# Patient Record
Sex: Male | Born: 1980
Health system: Southern US, Community
[De-identification: ages and names within clinical notes are randomized; demographics above are authoritative.]

## PROBLEM LIST (undated history)

## (undated) DIAGNOSIS — I10 Essential (primary) hypertension: Secondary | ICD-10-CM

## (undated) DIAGNOSIS — T63441A Toxic effect of venom of bees, accidental (unintentional), initial encounter: Secondary | ICD-10-CM

## (undated) DIAGNOSIS — F419 Anxiety disorder, unspecified: Secondary | ICD-10-CM

## (undated) DIAGNOSIS — L404 Guttate psoriasis: Secondary | ICD-10-CM

## (undated) DIAGNOSIS — I861 Scrotal varices: Secondary | ICD-10-CM

## (undated) HISTORY — DX: Toxic effect of venom of bees, accidental (unintentional), initial encounter: T63.441A

## (undated) HISTORY — DX: Essential (primary) hypertension: I10

## (undated) HISTORY — DX: Anxiety disorder, unspecified: F41.9

## (undated) HISTORY — DX: Guttate psoriasis: L40.4

## (undated) HISTORY — DX: Scrotal varices: I86.1

## (undated) HISTORY — PX: WISDOM TOOTH EXTRACTION: SHX21

---
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
## 2002-03-11 ENCOUNTER — Encounter: Admission: RE | Admit: 2002-03-11 | Discharge: 2002-03-11 | Payer: Self-pay | Admitting: Family Medicine

## 2002-03-11 ENCOUNTER — Encounter: Payer: Self-pay | Admitting: Family Medicine

## 2007-04-14 ENCOUNTER — Ambulatory Visit: Payer: Self-pay | Admitting: Internal Medicine

## 2007-04-22 LAB — CONVERTED CEMR LAB
ALT: 21 units/L (ref 0–53)
AST: 16 units/L (ref 0–37)
BUN: 11 mg/dL (ref 6–23)
Basophils Absolute: 0 10*3/uL (ref 0.0–0.1)
Basophils Relative: 0.6 % (ref 0.0–1.0)
CO2: 31 meq/L (ref 19–32)
Calcium: 10.2 mg/dL (ref 8.4–10.5)
Chloride: 106 meq/L (ref 96–112)
Cholesterol: 165 mg/dL (ref 0–200)
Creatinine, Ser: 1 mg/dL (ref 0.4–1.5)
Eosinophils Absolute: 0.2 10*3/uL (ref 0.0–0.7)
Eosinophils Relative: 5 % (ref 0.0–5.0)
GFR calc Af Amer: 117 mL/min
GFR calc non Af Amer: 97 mL/min
Glucose, Bld: 89 mg/dL (ref 70–99)
HCT: 51.7 % (ref 39.0–52.0)
Hemoglobin: 16.7 g/dL (ref 13.0–17.0)
Lymphocytes Relative: 41.4 % (ref 12.0–46.0)
MCHC: 32.4 g/dL (ref 30.0–36.0)
MCV: 87.5 fL (ref 78.0–100.0)
Monocytes Absolute: 0.4 10*3/uL (ref 0.1–1.0)
Monocytes Relative: 9.6 % (ref 3.0–12.0)
Neutro Abs: 1.9 10*3/uL (ref 1.4–7.7)
Neutrophils Relative %: 43.4 % (ref 43.0–77.0)
Platelets: 229 10*3/uL (ref 150–400)
Potassium: 4.9 meq/L (ref 3.5–5.1)
RBC: 5.91 M/uL — ABNORMAL HIGH (ref 4.22–5.81)
RDW: 11.9 % (ref 11.5–14.6)
Sodium: 141 meq/L (ref 135–145)
TSH: 0.67 microintl units/mL (ref 0.35–5.50)
WBC: 4.3 10*3/uL — ABNORMAL LOW (ref 4.5–10.5)

## 2008-02-03 ENCOUNTER — Ambulatory Visit: Payer: Self-pay | Admitting: Internal Medicine

## 2008-02-03 DIAGNOSIS — N508 Other specified disorders of male genital organs: Secondary | ICD-10-CM | POA: Insufficient documentation

## 2008-02-03 LAB — CONVERTED CEMR LAB
Bilirubin Urine: NEGATIVE
Blood in Urine, dipstick: NEGATIVE
Glucose, Urine, Semiquant: NEGATIVE
Ketones, urine, test strip: NEGATIVE
Nitrite: NEGATIVE
Protein, U semiquant: NEGATIVE
Specific Gravity, Urine: <1.005
Urobilinogen, UA: 0.2
WBC Urine, dipstick: NEGATIVE
pH: 7.5

## 2008-02-16 ENCOUNTER — Encounter: Payer: Self-pay | Admitting: Internal Medicine

## 2008-02-16 ENCOUNTER — Encounter: Admission: RE | Admit: 2008-02-16 | Discharge: 2008-02-16 | Payer: Self-pay | Admitting: Internal Medicine

## 2008-02-18 ENCOUNTER — Telehealth: Payer: Self-pay | Admitting: Internal Medicine

## 2008-03-16 ENCOUNTER — Ambulatory Visit: Payer: Self-pay | Admitting: Internal Medicine

## 2008-04-29 ENCOUNTER — Telehealth (INDEPENDENT_AMBULATORY_CARE_PROVIDER_SITE_OTHER): Payer: Self-pay | Admitting: *Deleted

## 2008-06-02 ENCOUNTER — Ambulatory Visit: Payer: Self-pay | Admitting: Internal Medicine

## 2008-06-02 DIAGNOSIS — I10 Essential (primary) hypertension: Secondary | ICD-10-CM | POA: Insufficient documentation

## 2008-12-05 ENCOUNTER — Ambulatory Visit: Payer: Self-pay | Admitting: Internal Medicine

## 2008-12-05 DIAGNOSIS — F411 Generalized anxiety disorder: Secondary | ICD-10-CM | POA: Insufficient documentation

## 2008-12-26 ENCOUNTER — Ambulatory Visit: Payer: Self-pay | Admitting: Internal Medicine

## 2009-03-29 ENCOUNTER — Ambulatory Visit: Payer: Self-pay | Admitting: Internal Medicine

## 2009-09-27 ENCOUNTER — Ambulatory Visit: Payer: Self-pay | Admitting: Internal Medicine

## 2009-09-28 LAB — CONVERTED CEMR LAB
BUN: 13 mg/dL (ref 6–23)
Basophils Absolute: 0 10*3/uL (ref 0.0–0.1)
Basophils Relative: 0.5 % (ref 0.0–3.0)
CO2: 29 meq/L (ref 19–32)
Calcium: 9.9 mg/dL (ref 8.4–10.5)
Chloride: 103 meq/L (ref 96–112)
Creatinine, Ser: 0.9 mg/dL (ref 0.4–1.5)
Eosinophils Absolute: 0.1 10*3/uL (ref 0.0–0.7)
Eosinophils Relative: 2.7 % (ref 0.0–5.0)
GFR calc non Af Amer: 113.76 mL/min (ref >60)
Glucose, Bld: 90 mg/dL (ref 70–99)
HCT: 47.5 % (ref 39.0–52.0)
Hemoglobin: 16.2 g/dL (ref 13.0–17.0)
Lymphocytes Relative: 31.4 % (ref 12.0–46.0)
Lymphs Abs: 1.7 10*3/uL (ref 0.7–4.0)
MCHC: 34.2 g/dL (ref 30.0–36.0)
MCV: 89 fL (ref 78.0–100.0)
Monocytes Absolute: 0.5 10*3/uL (ref 0.1–1.0)
Monocytes Relative: 9.9 % (ref 3.0–12.0)
Neutro Abs: 3.1 10*3/uL (ref 1.4–7.7)
Neutrophils Relative %: 55.5 % (ref 43.0–77.0)
Platelets: 256 10*3/uL (ref 150.0–400.0)
Potassium: 4 meq/L (ref 3.5–5.1)
RBC: 5.34 M/uL (ref 4.22–5.81)
RDW: 12.7 % (ref 11.5–14.6)
Sodium: 139 meq/L (ref 135–145)
TSH: 0.62 microintl units/mL (ref 0.35–5.50)
WBC: 5.5 10*3/uL (ref 4.5–10.5)

## 2010-01-30 ENCOUNTER — Other Ambulatory Visit: Payer: Self-pay | Admitting: Family Medicine

## 2010-01-30 ENCOUNTER — Ambulatory Visit
Admission: RE | Admit: 2010-01-30 | Discharge: 2010-01-30 | Payer: Self-pay | Source: Home / Self Care | Attending: Family Medicine | Admitting: Family Medicine

## 2010-01-30 DIAGNOSIS — R21 Rash and other nonspecific skin eruption: Secondary | ICD-10-CM | POA: Insufficient documentation

## 2010-01-30 LAB — HEPATIC FUNCTION PANEL
ALT: 20 U/L (ref 0–53)
AST: 16 U/L (ref 0–37)
Albumin: 4.6 g/dL (ref 3.5–5.2)
Alkaline Phosphatase: 55 U/L (ref 39–117)
Bilirubin, Direct: 0.2 mg/dL (ref 0.0–0.3)
Total Protein: 7.7 g/dL (ref 6.0–8.3)

## 2010-01-30 LAB — CBC WITH DIFFERENTIAL/PLATELET
Basophils Relative: 0.4 % (ref 0.0–3.0)
Eosinophils Absolute: 0.1 10*3/uL (ref 0.0–0.7)
HCT: 50 % (ref 39.0–52.0)
Hemoglobin: 17.1 g/dL — ABNORMAL HIGH (ref 13.0–17.0)
Lymphocytes Relative: 25 % (ref 12.0–46.0)
Lymphs Abs: 2 10*3/uL (ref 0.7–4.0)
MCHC: 34.2 g/dL (ref 30.0–36.0)
MCV: 87.8 fl (ref 78.0–100.0)
Monocytes Absolute: 0.7 10*3/uL (ref 0.1–1.0)
Monocytes Relative: 9 % (ref 3.0–12.0)
Neutro Abs: 5.1 10*3/uL (ref 1.4–7.7)
Neutrophils Relative %: 64 % (ref 43.0–77.0)
Platelets: 319 10*3/uL (ref 150.0–400.0)
RBC: 5.69 Mil/uL (ref 4.22–5.81)
WBC: 7.9 10*3/uL (ref 4.5–10.5)

## 2010-01-30 LAB — BASIC METABOLIC PANEL
BUN: 14 mg/dL (ref 6–23)
CO2: 31 mEq/L (ref 19–32)
Calcium: 10 mg/dL (ref 8.4–10.5)
Chloride: 100 mEq/L (ref 96–112)
Creatinine, Ser: 0.9 mg/dL (ref 0.4–1.5)
GFR: 107.62 mL/min (ref >60.00)
Glucose, Bld: 86 mg/dL (ref 70–99)
Potassium: 4.5 mEq/L (ref 3.5–5.1)

## 2010-01-30 LAB — SEDIMENTATION RATE: Sed Rate: 3 mm/hr (ref 0–22)

## 2010-02-06 NOTE — Assessment & Plan Note (Signed)
Summary: 4 mth fu/ns/kdc   Vital Signs:  Patient profile:   30 year old male Height:      71 inches Weight:      175 pounds BMI:     24.50 Pulse rate:   76 / minute BP sitting:   114 / 80  Vitals Entered By: Shary Decamp (March 29, 2009 4:02 PM) CC: rov - no concerns   History of Present Illness: routine office visit  Allergies: No Known Drug Allergies  Past History:  Past Medical History: Reviewed history from 12/05/2008 and no changes required. asthma as a child STING HORNETS WASPS&BEES CAUSE POISN&TOX REACT   Hypertension  started meds (Norvasc) 01-2008 Anxiety  Past Surgical History: Reviewed history from 04/14/2007 and no changes required. no surgeries  Social History: separated  no children works for the [employer]--no ETOH-- rarely   Review of Systems       was very anxious last year after his wife left him.  Feels much better now, going through a divorce but emotional  he feels okay denies suicidal or violent thoughts he is taking his BP medication daily, no apparent side effects, denies constipation  Physical Exam  General:  alert and well-developed.   Lungs:  normal respiratory effort, no intercostal retractions, and no accessory muscle use.   Heart:  normal rate, regular rhythm, and no murmur.   Extremities:  no pretibial edema bilaterally  Psych:  Oriented X3, memory intact for recent and remote, normally interactive, good eye contact, not anxious appearing, not depressed appearing, and not agitated.     Impression & Recommendations:  Problem # 1:  ANXIETY (ICD-300.00) doing great  Problem # 2:  HYPERTENSION (ICD-401.9) well-controlled no change recheck in 6 months his updated medication list for this problem includes:    Amlodipine Besylate 10 Mg Tabs (Amlodipine besylate) .Marland Kitchen... 1 by mouth once daily  Complete Medication List: 1)  Epipen 0.3 Mg/0.62ml (1:1000) Devi (Epinephrine hcl (anaphylaxis)) .... As directed 2)  Amlodipine  Besylate 10 Mg Tabs (Amlodipine besylate) .Marland Kitchen.. 1 by mouth once daily  Patient Instructions: 1)  Please schedule a follow-up appointment in 6 months .  Prescriptions: AMLODIPINE BESYLATE 10 MG TABS (AMLODIPINE BESYLATE) 1 by mouth once daily  #90 x 2   Entered and Authorized by:   Nolon Rod. Antasia Haider MD   Signed by:   Nolon Rod. Berley Gambrell MD on 03/29/2009   Method used:   Print then Give to Patient   RxID:   [id number]

## 2010-02-06 NOTE — Assessment & Plan Note (Signed)
Summary: 6 MONTH FOLLOWUP///SPH   Vital Signs:  Patient profile:   30 year old male Weight:      185 pounds Pulse rate:   85 / minute Pulse rhythm:   regular BP sitting:   142 / 86  (left arm) Cuff size:   large  Vitals Entered By: Army Fossa CMA (September 27, 2009 8:10 AM) CC: Pt here for f/u on BP, not fasting Comments Pharm- [facility] [address] Declines flu shot   History of Present Illness: ROV feels well, no concerns  Current Medications (verified): 1)  Epipen 0.3 Mg/0.4ml (1:1000)  Devi (Epinephrine Hcl (Anaphylaxis)) .... As Directed 2)  Amlodipine Besylate 10 Mg Tabs (Amlodipine Besylate) .Marland Kitchen.. 1 By Mouth Once Daily  Allergies (verified): No Known Drug Allergies  Past History:  Past Medical History: Reviewed history from 12/05/2008 and no changes required. asthma as a child STING HORNETS WASPS&BEES CAUSE POISN&TOX REACT   Hypertension  started meds (Norvasc) 01-2008 Anxiety  Past Surgical History: Reviewed history from 04/14/2007 and no changes required. no surgeries  Social History: Reviewed history from 03/29/2009 and no changes required. separated  no children works for the [employer]--no ETOH-- rarely   Review of Systems       denies lower extremity edema No nausea vomiting or diarrhea Ambulatory BP check rarely, last time it was 122/80 diet and exercise are okay but knows  there is room for improvement  Physical Exam  General:  alert, well-developed, and well-nourished.   Lungs:  normal respiratory effort, no intercostal retractions, and no accessory muscle use.   Heart:  normal rate, regular rhythm, and no murmur.   Extremities:  no pretibial edema bilaterally    Impression & Recommendations:  Problem # 1:  HYPERTENSION (ICD-401.9) systolic BP slightly high today, see  instructions Labs His updated medication list for this problem includes:    Amlodipine Besylate 10 Mg Tabs (Amlodipine besylate) .Marland Kitchen... 1 by mouth once  daily  BP today: 142/86 Prior BP: 114/80 (03/29/2009)  Labs Reviewed: K+: 4.9 (04/14/2007) Creat: : 1.0 (04/14/2007)   Chol: 165 (04/14/2007)     Orders: Venipuncture (10272) TLB-BMP (Basic Metabolic Panel-BMET) (80048-METABOL) TLB-CBC Platelet - w/Differential (85025-CBCD) TLB-TSH (Thyroid Stimulating Hormone) (84443-TSH)  Problem # 2:  explained the benefits of a flu shot, patient declined  to take it  Complete Medication List: 1)  Epipen 0.3 Mg/0.73ml (1:1000) Devi (Epinephrine hcl (anaphylaxis)) .... As directed 2)  Amlodipine Besylate 10 Mg Tabs (Amlodipine besylate) .Marland Kitchen.. 1 by mouth once daily  Other Orders: Specimen Handling (53664)  Patient Instructions: 1)  Check your blood pressure 2 or 3 times a month. If it is more than 140/85 consistently,please let us know . Ideal BP 120/80 2)  Please schedule a follow-up appointment in 6 to 8  months , fasting , physical exam Prescriptions: AMLODIPINE BESYLATE 10 MG TABS (AMLODIPINE BESYLATE) 1 by mouth once daily  #90 x 3   Entered and Authorized by:   Elita Quick E. Paz MD   Signed by:   Nolon Rod. Paz MD on 09/27/2009   Method used:   Print then Give to Patient   RxID:   [id number]

## 2010-02-08 NOTE — Assessment & Plan Note (Signed)
Summary: rash/paz pt needed morning appt/kn   Vital Signs:  Patient profile:   30 year old male Height:      71 inches Weight:      187.4 pounds BMI:     26.23 Pulse rate:   80 / minute Pulse rhythm:   regular BP sitting:   122 / 74  (right arm) Cuff size:   large  Vitals Entered By: Almeta Monas CMA Duncan Dull) (January 30, 2010 10:58 AM) CC: c/o rash on the chest, arms and neck x 2 weeks--treated with prednisone with no relief, Rash   History of Present Illness:  Rash      This is a 30 year old man who presents with Rash.  The symptoms began 3 weeks ago.  Pt went to [facility] care and was given 5 days prednisone but it just got worse when he finished it and it spread.  It started on abd.  Pt denies any illness prior to rash breaking out.  The patient complains of macules and itching.  The rash is located on the chest, back, abdomen, right arm, left arm, right leg, and left leg.  The rash is worse with heat.  The patient denies the following symptoms: fever, headache, facial swelling, tongue swelling, burning, difficulty breathing, abdominal pain, nausea, vomiting, diarrhea, dizziness, sore throat, dysuria, eye symptoms, arthralgias, and vaginal discharge.  The patient denies history of recent tick bite, recent tick exposure, other insect bite, recent infection, recent antibiotic use, new medication, new clothing, new topical exposure, recent travel, pet/animal contact, thyroid disease, chronic liver disease, autoimmune disease, chronic edema, and prior STD.    Current Medications (verified): 1)  Epipen 0.3 Mg/0.63ml (1:1000)  Devi (Epinephrine Hcl (Anaphylaxis)) .... As Directed 2)  Amlodipine Besylate 10 Mg Tabs (Amlodipine Besylate) .Marland Kitchen.. 1 By Mouth Once Daily  Allergies (verified): No Known Drug Allergies  Past History:  Past medical, surgical, family and social histories (including risk factors) reviewed for relevance to current acute and chronic problems.  Past Medical History: Reviewed  history from 12/05/2008 and no changes required. asthma as a child STING HORNETS WASPS&BEES CAUSE POISN&TOX REACT   Hypertension  started meds (Norvasc) 01-2008 Anxiety  Past Surgical History: Reviewed history from 04/14/2007 and no changes required. no surgeries  Family History: Reviewed history from 04/14/2007 and no changes required. prostate CA-no HTN-M,MGM CAD-MGM STROKE-MGM COLON CA-NO DM--Great aunt  Social History: Reviewed history from 03/29/2009 and no changes required. separated  no children works for the [employer] tobacco--no ETOH-- rarely   Review of Systems      See HPI  Physical Exam  General:  Well-developed,well-nourished,in no acute distress; alert,appropriate and cooperative throughout examination Skin:  macular rash--- some large plaques, scaley as well on arms, chest , back , abd , and legs Cervical Nodes:  No lymphadenopathy noted Psych:  Oriented X3 and normally interactive.     Impression & Recommendations:  Problem # 1:  SKIN RASH (ICD-782.1) prednisone taper claritin for itching  Orders: Depo- Medrol 80mg  (J1040) Admin of Therapeutic Inj  intramuscular or subcutaneous (04540) Venipuncture (98119) TLB-BMP (Basic Metabolic Panel-BMET) (80048-METABOL) TLB-CBC Platelet - w/Differential (85025-CBCD) TLB-Hepatic/Liver Function Pnl (80076-HEPATIC) TLB-Sedimentation Rate (ESR) (85652-ESR) Specimen Handling (14782) Dermatology Referral (Derma)  Discussed medication use and symptom control.   Complete Medication List: 1)  Epipen 0.3 Mg/0.49ml (1:1000) Devi (Epinephrine hcl (anaphylaxis)) .... As directed 2)  Amlodipine Besylate 10 Mg Tabs (Amlodipine besylate) .Marland Kitchen.. 1 by mouth once daily 3)  Prednisone 10 Mg Tabs (Prednisone) .... 3  by mouth once daily for 3 days then 2 by mouth once daily for 3 days then 1 by mouth once daily for 3 days 4)  Claritin 10 Mg Tabs (Loratadine) .Marland Kitchen.. 1 by mouth once daily as needed itching Prescriptions: PREDNISONE  10 MG TABS (PREDNISONE) 3 by mouth once daily for 3 days then 2 by mouth once daily for 3 days then 1 by mouth once daily for 3 days  #18 x 0   Entered and Authorized by:   Loreen Freud DO   Signed by:   Loreen Freud DO on 01/30/2010   Method used:   Electronically to        [facility]. [phone]* (retail)       [address]       [place], [place]  [address]       Ph: [phone]       Fax: [phone]   RxID:   [id number]    Medication Administration  Injection # 1:    Medication: Depo- Medrol 80mg     Diagnosis: SKIN RASH (ICD-782.1)    Route: IM    Site: RUOQ gluteus    Exp Date: 07/08/2010    Lot #: [id number]    Mfr: Pharmacia    Patient tolerated injection without complications    Given by: Almeta Monas CMA Duncan Dull) (January 30, 2010 11:09 AM)  Orders Added: 1)  Est. Patient Level III [44010] 2)  Depo- Medrol 80mg  [J1040] 3)  Admin of Therapeutic Inj  intramuscular or subcutaneous [96372] 4)  Venipuncture [36415] 5)  TLB-BMP (Basic Metabolic Panel-BMET) [80048-METABOL] 6)  TLB-CBC Platelet - w/Differential [85025-CBCD] 7)  TLB-Hepatic/Liver Function Pnl [80076-HEPATIC] 8)  TLB-Sedimentation Rate (ESR) [85652-ESR] 9)  Specimen Handling [99000] 10)  Dermatology Referral [Derma]

## 2010-02-10 ENCOUNTER — Encounter: Payer: Self-pay | Admitting: Family Medicine

## 2010-03-06 NOTE — Consult Note (Signed)
Summary: Dermatology-Dr. Nita Sells  Dermatology-Dr. Nita Sells   Imported By: Maryln Gottron 02/27/2010 13:42:26  _____________________________________________________________________  External Attachment:    Type:   Image     Comment:   External Document

## 2010-03-08 ENCOUNTER — Encounter: Payer: Self-pay | Admitting: Internal Medicine

## 2010-04-24 ENCOUNTER — Encounter: Payer: Self-pay | Admitting: Internal Medicine

## 2010-04-24 ENCOUNTER — Ambulatory Visit (INDEPENDENT_AMBULATORY_CARE_PROVIDER_SITE_OTHER): Payer: Self-pay | Admitting: Internal Medicine

## 2010-04-24 DIAGNOSIS — I10 Essential (primary) hypertension: Secondary | ICD-10-CM

## 2010-04-24 DIAGNOSIS — I861 Scrotal varices: Secondary | ICD-10-CM | POA: Insufficient documentation

## 2010-04-24 DIAGNOSIS — L404 Guttate psoriasis: Secondary | ICD-10-CM

## 2010-04-24 DIAGNOSIS — Z0000 Encounter for general adult medical examination without abnormal findings: Secondary | ICD-10-CM | POA: Insufficient documentation

## 2010-04-24 HISTORY — DX: Scrotal varices: I86.1

## 2010-04-24 HISTORY — DX: Guttate psoriasis: L40.4

## 2010-04-24 LAB — LIPID PANEL
Cholesterol: 152 mg/dL (ref 0–200)
HDL: 30 mg/dL — ABNORMAL LOW (ref >39.00)
Triglycerides: 88 mg/dL (ref 0.0–149.0)
VLDL: 17.6 mg/dL (ref 0.0–40.0)

## 2010-04-24 MED ORDER — AMLODIPINE BESYLATE 10 MG PO TABS: 10.0000 mg | ORAL_TABLET | Freq: Every day | ORAL | Status: DC

## 2010-04-24 NOTE — Assessment & Plan Note (Addendum)
Td 4-09 Diet and exercise discussed Has a history of varicocele, implications in reference to fertility discussed. Referral to urology if so desired. states will think about it  Labs

## 2010-04-24 NOTE — Progress Notes (Signed)
  Subjective:    Patient ID: Christopher Lester, male    DOB: [birth year], 30 y.o.   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 He did that, rash is not better, it is on and off. No pruritus  Past Medical History  Diagnosis Date  . Asthma     as a child  . Anxiety   . 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 HTN (hypertension)     started meds 2010   No past surgical history on file. History   Social History  . Marital Status: Married    Spouse Name: N/A    Number of Children:    . Years of Education: N/A   Occupational History  . works for the [place] [employer]   .     Social History Main Topics  . Smoking status: Never Smoker   . Smokeless tobacco: Never Used  . Alcohol Use: No     rarely  . Drug Use: No  . Sexually Active: Not on file   Other Topics Concern  . Not on file   Social History Narrative   Exercise- active @ work; diet needs to improve    Family History  Problem Relation Age of Onset  . Hypertension Mother   . Hypertension Maternal Grandmother   . Coronary artery disease Maternal Grandmother   . Stroke Maternal Grandmother   . Diabetes      aunt  . Colon cancer Neg Hx   . Prostate cancer Neg Hx      Review of Systems  Constitutional: Negative for fever and unexpected weight change.  Respiratory: Negative for cough and wheezing.   Cardiovascular: Negative for chest pain and leg swelling.  Gastrointestinal: Negative for nausea, vomiting and abdominal pain.  Genitourinary: Negative for urgency and hematuria.       Objective:   Physical Exam  Constitutional: He is oriented to person, place, and time. He appears well-developed and well-nourished.  HENT:  Head: Normocephalic and atraumatic.  Neck: No thyromegaly present.  Cardiovascular: Normal rate, regular rhythm and normal heart sounds.  Exam reveals no friction rub.   No murmur  heard. Pulmonary/Chest: Breath sounds normal. No respiratory distress. He has no wheezes. He has no rales.  Abdominal: Soft. Bowel sounds are normal. He exhibits no distension. There is no tenderness. There is no rebound and no guarding.  Musculoskeletal: He exhibits no edema.  Neurological: He is alert and oriented to person, place, and time.  Skin: Skin is warm.       Multiple skin lesions abdomen, legs >back>arms. None @ face. Lesions are papular, moderately red, not scaly, less than 1 cm in size.  Psychiatric: He has a normal mood and affect. His behavior is normal. Judgment and thought content normal.          Assessment & Plan:

## 2010-04-24 NOTE — Assessment & Plan Note (Signed)
Recommend to see dermatology again

## 2010-04-24 NOTE — Assessment & Plan Note (Signed)
No change 

## 2010-04-26 ENCOUNTER — Telehealth: Payer: Self-pay | Admitting: *Deleted

## 2010-04-26 NOTE — Telephone Encounter (Signed)
Message copied by Army Fossa on Thu Apr 26, 2010  9:40 AM ------      Message from: Christopher Lester      Created: Wed Apr 25, 2010  5:27 PM       Advise patient:      His cholesterol is very good, only the HDL (good cholesterol) needs to improve, it is 30 and ideally should be 40.      Exercise and a healthy diet will help.

## 2010-04-26 NOTE — Telephone Encounter (Signed)
Message left for patient to return my call.  

## 2010-04-26 NOTE — Telephone Encounter (Signed)
I spoke w/ pt he is aware.  

## 2010-10-24 ENCOUNTER — Encounter: Payer: Self-pay | Admitting: Internal Medicine

## 2010-10-24 ENCOUNTER — Ambulatory Visit (INDEPENDENT_AMBULATORY_CARE_PROVIDER_SITE_OTHER): Payer: BC Managed Care – PPO | Admitting: Internal Medicine

## 2010-10-24 VITALS — BP 126/82 | HR 89 | Temp 99.1°F | Resp 16 | Wt 191.0 lb

## 2010-10-24 DIAGNOSIS — L404 Guttate psoriasis: Secondary | ICD-10-CM

## 2010-10-24 DIAGNOSIS — I10 Essential (primary) hypertension: Secondary | ICD-10-CM

## 2010-10-24 DIAGNOSIS — F411 Generalized anxiety disorder: Secondary | ICD-10-CM

## 2010-10-24 DIAGNOSIS — L408 Other psoriasis: Secondary | ICD-10-CM

## 2010-10-24 NOTE — Assessment & Plan Note (Signed)
Lesions self resolve a few months ago without treatment

## 2010-10-24 NOTE — Assessment & Plan Note (Signed)
Well controlled, continue with same meds. His last cholesterol panel was discussed, HDL is slightly low, recommend daily exercise

## 2010-10-24 NOTE — Progress Notes (Signed)
  Subjective:    Patient ID: Christopher Lester, male    DOB: 09-05-1980, 30 y.o.   MRN: [id number]  HPI Routine office visit, feeling well.  Past Medical History  Diagnosis Date  . Asthma     as a child  . Anxiety   . 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 HTN (hypertension)     started meds 2010  . Guttate psoriasis 04/24/2010   Past Surgical History  Procedure Date  . No past surgeries       Review of Systems Good medication compliance with BP meds, ambulatory blood pressures in the 120/80 He was diagnosed with psoriasis, it self resolve without treatment. lifestyle continued to be healthy. Denies any anxiety at the present time    Objective:   Physical Exam  Constitutional: He is oriented to person, place, and time. He appears well-developed and well-nourished.  Cardiovascular: Normal rate, regular rhythm and normal heart sounds.   No murmur heard. Pulmonary/Chest: Effort normal and breath sounds normal. No respiratory distress. He has no wheezes. He has no rales.  Musculoskeletal: He exhibits no edema.  Neurological: He is alert and oriented to person, place, and time.  Psychiatric: He has a normal mood and affect. His behavior is normal. Judgment and thought content normal.          Assessment & Plan:

## 2010-10-24 NOTE — Assessment & Plan Note (Signed)
asx at present

## 2011-02-27 ENCOUNTER — Other Ambulatory Visit: Payer: Self-pay | Admitting: Internal Medicine

## 2011-02-27 NOTE — Telephone Encounter (Signed)
Refill done.  

## 2011-04-24 ENCOUNTER — Ambulatory Visit (INDEPENDENT_AMBULATORY_CARE_PROVIDER_SITE_OTHER): Payer: PRIVATE HEALTH INSURANCE | Admitting: Internal Medicine

## 2011-04-24 ENCOUNTER — Encounter: Payer: Self-pay | Admitting: Internal Medicine

## 2011-04-24 DIAGNOSIS — I10 Essential (primary) hypertension: Secondary | ICD-10-CM

## 2011-04-24 DIAGNOSIS — Z0000 Encounter for general adult medical examination without abnormal findings: Secondary | ICD-10-CM

## 2011-04-24 LAB — COMPREHENSIVE METABOLIC PANEL
Alkaline Phosphatase: 52 U/L (ref 39–117)
BUN: 12 mg/dL (ref 6–23)
Creatinine, Ser: 0.7 mg/dL (ref 0.4–1.5)
Glucose, Bld: 87 mg/dL (ref 70–99)
Total Bilirubin: 1.3 mg/dL — ABNORMAL HIGH (ref 0.3–1.2)

## 2011-04-24 LAB — LIPID PANEL
Cholesterol: 150 mg/dL (ref 0–200)
LDL Cholesterol: 103 mg/dL — ABNORMAL HIGH (ref 0–99)
Triglycerides: 56 mg/dL (ref 0.0–149.0)
VLDL: 11.2 mg/dL (ref 0.0–40.0)

## 2011-04-24 MED ORDER — EPINEPHRINE 0.3 MG/0.3ML IJ DEVI: 0.3000 mg | Freq: Once | INTRAMUSCULAR | Status: DC

## 2011-04-24 MED ORDER — AMLODIPINE BESYLATE 10 MG PO TABS: 10.0000 mg | ORAL_TABLET | Freq: Every day | ORAL | Status: DC

## 2011-04-24 NOTE — Patient Instructions (Addendum)
You're doing great, continue with your healthy lifestyle. If his psoriasis is not getting better, please contact a dermatologist. ---- Check the  blood pressure 2 or 3 times a week, be sure it is less than 135/85. If it is consistently higher, let me know

## 2011-04-24 NOTE — Assessment & Plan Note (Addendum)
Td 4-09 Diet and exercise discussed Labs STE   

## 2011-04-24 NOTE — Assessment & Plan Note (Signed)
Well-controlled, no apparent side effects. Refill medications.

## 2011-04-24 NOTE — Progress Notes (Signed)
  Subjective:    Patient ID: Christopher Lester, male    DOB: 11/13/1980, 31 y.o.   MRN: [id number]  HPI Yearly checkup, feeling well. No major concerns.  Past medical history Hypertension, started medications in 2010 Asthma as a child Anxiety guttate psoriasis, diagnosed 04/2010 Severe allergic reactions to bee stings and hornets  PSH No major  Family History:  colon ca-- no   prostate CA-no HTN-M,MGM CAD-MGM STROKE-MGM DM--Great aunt  Social History: Married, 1 children works for the [employer] Tobacco-- no ETOH-- rarely Diet-- has improved significantly, + wt loss Exercise-- remains active    Review of Systems Has a lot of stress at work but handling it okay. Good medication compliance with amlodipine, ambulatory BP is around 120/80. No chest pain, shortness of breath or lower extremity edema No nausea, vomiting or diarrhea. No dysuria or gross hematuria. He has psoriasis, symptoms exacerbated November 2012, and they are getting better by itself     Objective:   Physical Exam General -- alert, well-developed, and well-nourished.   Neck --no thyromegaly  Lungs -- normal respiratory effort, no intercostal retractions, no accessory muscle use, and normal breath sounds.   Heart-- normal rate, regular rhythm, no murmur, and no gallop.   Abdomen--soft, non-tender, no distention, no masses, no HSM, no guarding, and no rigidity.   Extremities-- no pretibial edema bilaterally; a few lesions distally consistent with the history of psoriasis Neurologic-- alert & oriented X3 and strength normal in all extremities. Psych-- Cognition and judgment appear intact. Alert and cooperative with normal attention span and concentration.  not anxious appearing and not depressed appearing.       Assessment & Plan:

## 2011-12-02 ENCOUNTER — Encounter: Payer: Self-pay | Admitting: Internal Medicine

## 2011-12-02 ENCOUNTER — Ambulatory Visit (INDEPENDENT_AMBULATORY_CARE_PROVIDER_SITE_OTHER): Payer: PRIVATE HEALTH INSURANCE | Admitting: Internal Medicine

## 2011-12-02 VITALS — BP 132/84 | HR 98 | Wt 165.0 lb

## 2011-12-02 DIAGNOSIS — K469 Unspecified abdominal hernia without obstruction or gangrene: Secondary | ICD-10-CM

## 2011-12-02 NOTE — Patient Instructions (Addendum)
Hernia A hernia occurs when an internal organ pushes out through a weak spot in the abdominal wall. Hernias most commonly occur in the groin and around the navel. Hernias often can be pushed back into place (reduced). Most hernias tend to get worse over time. Some abdominal hernias can get stuck in the opening (irreducible or incarcerated hernia) and cannot be reduced. An irreducible abdominal hernia which is tightly squeezed into the opening is at risk for impaired blood supply (strangulated hernia). A strangulated hernia is a medical emergency. Because of the risk for an irreducible or strangulated hernia, surgery may be recommended to repair a hernia. CAUSES   Heavy lifting.  Prolonged coughing.  Straining to have a bowel movement.  A cut (incision) made during an abdominal surgery. HOME CARE INSTRUCTIONS   Bed rest is not required. You may continue your normal activities.  Avoid lifting more than 10 pounds (4.5 kg) or straining.  Cough gently. If you are a smoker it is best to stop. Even the best hernia repair can break down with the continual strain of coughing. Even if you do not have your hernia repaired, a cough will continue to aggravate the problem.  Do not wear anything tight over your hernia. Do not try to keep it in with an outside bandage or truss. These can damage abdominal contents if they are trapped within the hernia sac.  Eat a normal diet.  Avoid constipation. Straining over long periods of time will increase hernia size and encourage breakdown of repairs. If you cannot do this with diet alone, stool softeners may be used. SEEK IMMEDIATE MEDICAL CARE IF:   You have a fever.  You develop increasing abdominal pain.  You feel nauseous or vomit.  Your hernia is stuck outside the abdomen, looks discolored, feels hard, or is tender.  You have any changes in your bowel habits or in the hernia that are unusual for you.  You have increased pain or swelling around the  hernia.  You cannot push the hernia back in place by applying gentle pressure while lying down. MAKE SURE YOU:   Understand these instructions.  Will watch your condition.  Will get help right away if you are not doing well or get worse. Document Released: 12/24/2004 Document Revised: 03/18/2011 Document Reviewed: 08/13/2007 [employer] Patient Information 2013 [employer].  

## 2011-12-02 NOTE — Progress Notes (Signed)
  Subjective:    Patient ID: Christopher Lester, male    DOB: 1981-01-04, 31 y.o.   MRN: [id number]  HPI Acute visit 4 weeks history of on and off mild discomfort at the left suprapubic area, also has a lump there on and off, usually triggered by coughing, lifting or having a BM  Past medical history Hypertension, started medications in 2010 Asthma as a child Anxiety guttate psoriasis, diagnosed 04/2010 Severe allergic reactions to bee stings and hornets  PSH No major   Family History:  colon ca-- no   prostate CA-no HTN-M,MGM CAD-MGM STROKE-MGM DM--Great aunt  Social History: Married, 1 children works for the [employer] Tobacco-- no ETOH-- rarely Diet-- has improved significantly, + wt loss Exercise-- remains active     Review of Systems No fever or chills No nausea, vomiting, diarrhea. No abdominal pain Few days ago his urine looked slightly dark however he denies any dysuria, difficulty urinating or gross hematuria. urine is now back to normal.     Objective:   Physical Exam  Constitutional: He appears well-developed and well-nourished. No distress.  Abdominal: Soft. Bowel sounds are normal. He exhibits no distension. There is no tenderness. There is no rebound and no guarding.  Genitourinary:          Penis normal, scrotal contents normal.  Skin: He is not diaphoretic.       Assessment & Plan:

## 2011-12-02 NOTE — Assessment & Plan Note (Signed)
31 year old gentleman with history of varicocele, left more than right presents with what seems to be a inguinal hernia. Discussed the patient what a hernia is, potential complications; plan to  refer to surgery.

## 2011-12-12 ENCOUNTER — Ambulatory Visit (INDEPENDENT_AMBULATORY_CARE_PROVIDER_SITE_OTHER): Payer: PRIVATE HEALTH INSURANCE | Admitting: General Surgery

## 2011-12-12 ENCOUNTER — Encounter (INDEPENDENT_AMBULATORY_CARE_PROVIDER_SITE_OTHER): Payer: Self-pay | Admitting: General Surgery

## 2011-12-12 VITALS — BP 158/98 | HR 76 | Resp 16 | Ht 70.0 in | Wt 165.0 lb

## 2011-12-12 DIAGNOSIS — K469 Unspecified abdominal hernia without obstruction or gangrene: Secondary | ICD-10-CM

## 2011-12-12 NOTE — Patient Instructions (Signed)
If you change how you would like your hernia repaired, please call  Hernia Repair with Laparoscope A hernia occurs when an internal organ pushes out through a weak spot in the belly (abdominal) wall muscles. Hernias most commonly occur in the groin and around the navel. Hernias can also occur through a cut by the surgeon (incision) after an abdominal operation. A hernia may be caused by:  Lifting heavy objects.  Prolonged coughing.  Straining to move your bowels. Hernias can often be pushed back into place (reduced). Most hernias tend to get worse over time. Problems occur when abdominal contents get stuck in the opening and the blood supply is blocked or impaired (incarcerated hernia). Because of these risks, you require surgery to repair the hernia. Your hernia will be repaired using a laparoscope. Laparoscopic surgery is a type of minimally invasive surgery. It does not involve making a typical surgical cut (incision) in the skin. A laparoscope is a telescope-like rod and lens system. It is usually connected to a video camera and a light source so your caregiver can clearly see the operative area. The instruments are inserted through  to  inch (5 mm or 10 mm) openings in the skin at specific locations. A working and viewing space is created by blowing a small amount of carbon dioxide gas into the abdominal cavity. The abdomen is essentially blown up like a balloon (insufflated). This elevates the abdominal wall above the internal organs like a dome. The carbon dioxide gas is common to the human body and can be absorbed by tissue and removed by the respiratory system. Once the repair is completed, the small incisions will be closed with either stitches (sutures) or staples (just like a paper stapler only this staple holds the skin together). LET YOUR CAREGIVERS KNOW ABOUT:  Allergies.  Medications taken including herbs, eye drops, over the counter medications, and creams.  Use of steroids (by  mouth or creams).  Previous problems with anesthetics or Novocaine.  Possibility of pregnancy, if this applies.  History of blood clots (thrombophlebitis).  History of bleeding or blood problems.  Previous surgery.  Other health problems. BEFORE THE PROCEDURE  Laparoscopy can be done either in a hospital or out-patient clinic. You may be given a mild sedative to help you relax before the procedure. Once in the operating room, you will be given a general anesthesia to make you sleep (unless you and your caregiver choose a different anesthetic).  AFTER THE PROCEDURE  After the procedure you will be watched in a recovery area. Depending on what type of hernia was repaired, you might be admitted to the hospital or you might go home the same day. With this procedure you may have less pain and scarring. This usually results in a quicker recovery and less risk of infection. HOME CARE INSTRUCTIONS   Bed rest is not required. You may continue your normal activities but avoid heavy lifting (more than 10 pounds) or straining.  Cough gently. If you are a smoker it is best to stop, as even the best hernia repair can break down with the continual strain of coughing.  Avoid driving until given the OK by your surgeon.  There are no dietary restrictions unless given otherwise.  TAKE ALL MEDICATIONS AS DIRECTED.  Only take over-the-counter or prescription medicines for pain, discomfort, or fever as directed by your caregiver. SEEK MEDICAL CARE IF:   There is increasing abdominal pain or pain in your incisions.  There is more bleeding from incisions,  other than minimal spotting.  You feel light headed or faint.  You develop an unexplained fever, chills, and/or an oral temperature above 102 F (38.9 C).  You have redness, swelling, or increasing pain in the wound.  Pus coming from wound.  A foul smell coming from the wound or dressings. SEEK IMMEDIATE MEDICAL CARE IF:   You develop a  rash.  You have difficulty breathing.  You have any allergic problems. MAKE SURE YOU:   Understand these instructions.  Will watch your condition.  Will get help right away if you are not doing well or get worse. Document Released: 12/24/2004 Document Revised: 03/18/2011 Document Reviewed: 11/23/2008 [facility] Patient Information 2013 [place], [place].

## 2011-12-12 NOTE — Progress Notes (Signed)
Patient ID: Christopher Lester, male   DOB: March 07, 1980, 31 y.o.   MRN: [id number]  Chief Complaint  Patient presents with  . Inguinal Hernia    left    HPI Christopher Lester is a 31 y.o. male.   HPI 31 yo WM referred by Dr Drue Novel for evaluation of left inguinal hernia. The patient states that he noticed a problem in his left groin about 4 weeks ago. He states that he primarily notices it when he lifts something heavy. He denies any fever, chills, nausea, vomiting, diarrhea or constipation. He denies any trouble urinating. It does cause him some generalized discomfort on occasion. When it does bother him he describes it as a sharp discomfort. He denies any numbness, tingling or burning in his groin.  He also wants me to look at a small lump on his right lateral chest wall. He states that he just noticed it yesterday. He denies any weight loss. He denies any trauma to the area. He denies any drainage from the area. Past Medical History  Diagnosis Date  . Asthma     as a child  . Anxiety   . 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 HTN (hypertension)     started meds 2010  . 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 Wisdom tooth extraction age 43     Family History  Problem Relation Age of Onset  . Hypertension Mother   . Hypertension Maternal Grandmother   . Coronary artery disease Maternal Grandmother   . Stroke Maternal Grandmother   . Diabetes      aunt  . Colon cancer Neg Hx   . Prostate cancer Neg Hx     Social History History  Substance Use Topics  . Smoking status: Never Smoker   . Smokeless tobacco: Never Used  . Alcohol Use: No     Comment: rarely    No Known Allergies  Current Outpatient Prescriptions  Medication Sig Dispense Refill  . amLODipine (NORVASC) 10 MG tablet Take 1 tablet (10 mg total) by mouth daily.  90 tablet  3  . cetirizine (ZYRTEC) 10 MG tablet Take 10 mg by mouth daily.          Review of  Systems Review of Systems  Constitutional: Negative for fever, chills, appetite change and unexpected weight change.  HENT: Negative for congestion and trouble swallowing.   Eyes: Negative for visual disturbance.  Respiratory: Negative for chest tightness and shortness of breath.   Cardiovascular: Negative for chest pain and leg swelling.       No PND, no orthopnea, no DOE  Gastrointestinal:       See HPI  Genitourinary: Negative for dysuria and hematuria.  Musculoskeletal: Negative.   Skin: Negative for rash.  Neurological: Negative for seizures and speech difficulty.  Hematological: Does not bruise/bleed easily.  Psychiatric/Behavioral: Negative for behavioral problems and confusion.    Blood pressure 158/98, pulse 76, resp. rate 16, height 5\' 10"  (1.778 m), weight 165 lb (74.844 kg).  Physical Exam Physical Exam  Vitals reviewed. Constitutional: He is oriented to person, place, and time. He appears well-developed and well-nourished. No distress.  HENT:  Head: Normocephalic and atraumatic.  Right Ear: External ear normal.  Left Ear: External ear normal.  Eyes: Conjunctivae normal are normal. No scleral icterus.  Neck: Normal range of motion. Neck supple. No tracheal deviation present. No thyromegaly present.  Cardiovascular: Normal  rate, regular rhythm and normal heart sounds.   Pulmonary/Chest: Effort normal and breath sounds normal. No respiratory distress. He has no wheezes.  Abdominal: Soft. Bowel sounds are normal. He exhibits no distension. There is no tenderness. There is no rebound. A hernia is present. Hernia confirmed positive in the left inguinal area. Hernia confirmed negative in the right inguinal area.  Genitourinary: Testes normal and penis normal.     Musculoskeletal: He exhibits no edema and no tenderness.  Lymphadenopathy:    He has no cervical adenopathy.  Neurological: He is alert and oriented to person, place, and time. He exhibits normal muscle tone.   Skin: Skin is warm and dry. He is not diaphoretic.          Scattered small varying size red skin lesions on trunk/abdomen c/w psoriasis; right lower lateral chest wall overlying ribs - soft, well circumscribed subcu mass c/w lipoma about 2 x1.5 cm  Psychiatric: He has a normal mood and affect. His behavior is normal. Judgment and thought content normal.    Data Reviewed Dr Drue Novel' note   Assessment    Left inguinal hernia Right lateral chest wall lipoma    Plan    We discussed the etiology of inguinal hernias. We discussed the signs & symptoms of incarceration & strangulation.  We discussed non-operative and operative management. We discussed laparoscopic and open repairs and the pros and cons of each and how they differ.  We also discussed lipomas - he would like to leave this alone for now.   The patient has elected to speak with the surgery schedulers to see if there is any OR availability this calendar year. If there is he is leaning toward going ahead and having surgery. If there is no time left, he will probably wait til sometime in 2014 to schedule  He is currently leaning towards LAPAROSCOPIC REPAIR OF LEFT INGUINAL HERNIA WITH MESH   I described the procedure in detail.  The patient was given educational material. We discussed the risks and benefits including but not limited to bleeding, infection, chronic inguinal pain, nerve entrapment, hernia recurrence, mesh complications, hematoma formation, urinary retention, injury to the testicles or the ovaries, numbness in the groin, blood clots, injury to the surrounding structures, and anesthesia risk. We also discussed the typical post operative recovery course, including no heavy lifting for 4-6 weeks. I explained that the likelihood of improvement of their symptoms is good  Mary Sella. Andrey Campanile, MD, FACS General, Bariatric, & Minimally Invasive Surgery [facility], [place]         [facility] M 12/12/2011, 5:15 PM

## 2012-02-08 HISTORY — PX: HERNIA REPAIR: SHX51

## 2012-02-17 DIAGNOSIS — K4090 Unilateral inguinal hernia, without obstruction or gangrene, not specified as recurrent: Secondary | ICD-10-CM

## 2012-02-18 ENCOUNTER — Telehealth (INDEPENDENT_AMBULATORY_CARE_PROVIDER_SITE_OTHER): Payer: Self-pay | Admitting: General Surgery

## 2012-02-18 NOTE — Telephone Encounter (Signed)
Message copied by Liliana Cline on Tue Feb 18, 2012 11:12 AM ------      Message from: Zacarias Pontes      Created: Tue Feb 18, 2012 11:07 AM       Pt needs note to RTW on 2/26 with LIGHT DUTY mailed to him please ..address in epic is correct .sx was on 2/10 for hernia ------

## 2012-02-18 NOTE — Telephone Encounter (Signed)
Note written and mailed to patient.

## 2012-02-22 ENCOUNTER — Other Ambulatory Visit: Payer: Self-pay

## 2012-03-19 ENCOUNTER — Encounter: Payer: Self-pay | Admitting: Internal Medicine

## 2012-03-20 ENCOUNTER — Encounter (INDEPENDENT_AMBULATORY_CARE_PROVIDER_SITE_OTHER): Payer: Self-pay | Admitting: General Surgery

## 2012-03-20 ENCOUNTER — Ambulatory Visit (INDEPENDENT_AMBULATORY_CARE_PROVIDER_SITE_OTHER): Payer: 59 | Admitting: General Surgery

## 2012-03-20 VITALS — BP 128/72 | HR 80 | Temp 97.8°F | Resp 18 | Ht 70.0 in | Wt 166.0 lb

## 2012-03-20 DIAGNOSIS — Z09 Encounter for follow-up examination after completed treatment for conditions other than malignant neoplasm: Secondary | ICD-10-CM

## 2012-03-20 NOTE — Progress Notes (Signed)
Subjective:     Patient ID: Christopher Lester, male   DOB: 1980/03/28, 32 y.o.   MRN: [id number]  HPI  32 year old Caucasian male comes in for followup after undergoing laparoscopic repair of a left indirect inguinal hernia with mesh on 02/17/2012. He states that he did well after surgery. He states that the first 3-4 days after surgery were pretty rough however after that he did not have much discomfort. He only took the pain medication for 3 days. He denies any numbness, tingling, or burning sensation in his groin. He denies any inguinal discomfort. He denies any nausea, vomiting, diarrhea. He has returned to work with limited activity Review of Systems     Objective:   Physical Exam BP 128/72  Pulse 80  Temp(Src) 97.8 F (36.6 C)  Resp 18  Ht 5\' 10"  (1.778 m)  Wt 166 lb (75.297 kg)  BMI 23.82 kg/m2  Gen: alert, NAD, non-toxic appearing Abd: soft, nontender, nondistended. Well-healed trocar sites. No cellulitis. No incisional hernia GU: both testicles descended, no scrotal masses, no sign of hernia recurrence Ext: no edema     Assessment:     Status post laparoscopic repair of left indirect inguinal hernia with mesh     Plan:     Overall I am very pleased with how he is doing. He was reminded to avoid heavy lifting and strenuous activity for another 2 weeks. I encouraged him to slowly ease back into full activities at that time. Since he is doing so well I will see him on an as-needed basis  Mary Sella. Andrey Campanile, MD, FACS General, Bariatric, & Minimally Invasive Surgery [facility], [place]

## 2012-03-20 NOTE — Patient Instructions (Signed)
Can resume full activities in another 2 weeks

## 2012-04-23 ENCOUNTER — Encounter: Payer: PRIVATE HEALTH INSURANCE | Admitting: Internal Medicine

## 2012-05-08 ENCOUNTER — Other Ambulatory Visit: Payer: Self-pay | Admitting: Internal Medicine

## 2012-05-08 NOTE — Telephone Encounter (Signed)
Pt has a future appt 6.24.14 Refill done.

## 2012-06-30 ENCOUNTER — Encounter: Payer: Self-pay | Admitting: Internal Medicine

## 2012-08-26 ENCOUNTER — Encounter: Payer: Self-pay | Admitting: Internal Medicine

## 2012-08-26 ENCOUNTER — Ambulatory Visit (INDEPENDENT_AMBULATORY_CARE_PROVIDER_SITE_OTHER): Payer: 59 | Admitting: Internal Medicine

## 2012-08-26 VITALS — BP 120/80 | HR 84 | Temp 98.6°F | Ht 70.8 in | Wt 170.8 lb

## 2012-08-26 DIAGNOSIS — I10 Essential (primary) hypertension: Secondary | ICD-10-CM

## 2012-08-26 DIAGNOSIS — T63441A Toxic effect of venom of bees, accidental (unintentional), initial encounter: Secondary | ICD-10-CM | POA: Insufficient documentation

## 2012-08-26 DIAGNOSIS — Z0000 Encounter for general adult medical examination without abnormal findings: Secondary | ICD-10-CM

## 2012-08-26 DIAGNOSIS — F411 Generalized anxiety disorder: Secondary | ICD-10-CM

## 2012-08-26 LAB — COMPREHENSIVE METABOLIC PANEL
Albumin: 4.6 g/dL (ref 3.5–5.2)
Alkaline Phosphatase: 47 U/L (ref 39–117)
BUN: 10 mg/dL (ref 6–23)
Creatinine, Ser: 0.8 mg/dL (ref 0.4–1.5)
Glucose, Bld: 97 mg/dL (ref 70–99)
Total Bilirubin: 1 mg/dL (ref 0.3–1.2)

## 2012-08-26 LAB — CBC WITH DIFFERENTIAL/PLATELET
Basophils Relative: 0.4 % (ref 0.0–3.0)
Eosinophils Relative: 2.1 % (ref 0.0–5.0)
HCT: 46.7 % (ref 39.0–52.0)
Hemoglobin: 16 g/dL (ref 13.0–17.0)
MCV: 85 fl (ref 78.0–100.0)
Monocytes Absolute: 0.4 10*3/uL (ref 0.1–1.0)
Neutro Abs: 3.4 10*3/uL (ref 1.4–7.7)
Neutrophils Relative %: 60.4 % (ref 43.0–77.0)
RBC: 5.5 Mil/uL (ref 4.22–5.81)
WBC: 5.6 10*3/uL (ref 4.5–10.5)

## 2012-08-26 LAB — LIPID PANEL
LDL Cholesterol: 89 mg/dL (ref 0–99)
Total CHOL/HDL Ratio: 4
Triglycerides: 70 mg/dL (ref 0.0–149.0)

## 2012-08-26 MED ORDER — EPINEPHRINE 0.3 MG/0.3ML IJ SOAJ: 1.0000 mg | Freq: Once | INTRAMUSCULAR | Status: DC

## 2012-08-26 NOTE — Assessment & Plan Note (Signed)
wll controlled, no s/e , no change

## 2012-08-26 NOTE — Assessment & Plan Note (Signed)
Td 4-09 Diet and exercise discussed Labs STE   

## 2012-08-26 NOTE — Patient Instructions (Addendum)
Get your blood work before you leave  Next visit in  1 year for a physical exam   Please make an appointment before you leave the office today (or call few weeks in advance) ---- Check the  blood pressure 2 or 3 times a week, be sure it is between 110/60 and 140/85. If it is consistently higher or lower, let me know    Testicular Problems and Self-Exam Men can examine themselves easily and effectively with positive results. Monthly exams detect problems early and save lives. There are numerous causes of swelling in the testicle. Testicular cancer usually appears as a firm painless lump in the front part of the testicle. This may feel like a dull ache or heavy feeling located in the lower abdomen (belly), groin, or scrotum.  The risk is greater in men with undescended testicles and it is more common in young men. It is responsible for almost a fifth of cancers in males between ages 11 and 45. Other common causes of swellings, lumps, and testicular pain include injuries, inflammation (soreness) from infection, hydrocele, and torsion. These are a few of the reasons to do monthly self-examination of the testicles. The exam only takes minutes and could add years to your life. Get in the habit! SELF-EXAMINATION OF THE TESTICLES The testicles are easiest to examine after warm baths or showers and are more difficult to examine when you are cold. This is because the muscles attached to the testicles retract and pull them up higher or into the abdomen. While standing, roll one testicle between the thumb and forefinger. Feel for lumps, swelling, or discomfort. A normal testicle is egg shaped and feels firm. It is smooth and not tender. The spermatic cord can be felt as a firm spaghetti-like cord at the back of the testicle. It is also important to examine your groins. This is the crease between the front of your leg and your abdomen. Also, feel for enlarged lymph nodes (glands). Enlarged nodes are also a cause for  you to see your caregiver for evaluation.  Self-examination of the testicles and groin areas on a regular basis will help you to know what your own testicles and groins feel like. This will help you pick up an abnormality (difference) at an earlier stage. Early discovery is the key to curing this cancer or treating other conditions. Any lump, change, or swelling in the testicle calls for immediate evaluation by your caregiver. Cancer of the testicle does not result in impotence and it does not prevent normal intercourse or prevent having children. If your caregiver feels that medical treatment or chemotherapy could lead to infertility, sperm can be frozen for future use. It is necessary to see a caregiver as soon as possible after the discovery of a lump in a testicle. Document Released: 04/01/2000 Document Revised: 03/18/2011 Document Reviewed: 12/26/2007 [facility] Patient Information 2014 [place], [place].

## 2012-08-26 NOTE — Progress Notes (Signed)
  Subjective:    Patient ID: Christopher Lester, male    DOB: 29-Apr-1980, 32 y.o.   MRN: [id number]  HPI CPX  Past Medical History  Diagnosis Date  . Asthma     as a child  . Anxiety   . 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 HTN (hypertension)     started meds 2010  . 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 Varicocele 04/24/2010    Varicocele diagnosed with a scrotal ultrasound, left greater than right    Past Surgical History  Procedure Laterality Date  . Wisdom tooth extraction  age 25   . Hernia repair Left 02-2012   History   Social History  . Marital Status: Married    Spouse Name: N/A    Number of Children:  1  . Years of Education: N/A   Occupational History  . works for the [place] [employer]   .     Social History Main Topics  . Smoking status: Never Smoker   . Smokeless tobacco: Never Used  . Alcohol Use: Yes     Comment: rarely  . Drug Use: No  . Sexual Activity: Not on file   Other Topics Concern  . Not on file   Social History Narrative   Married, 1 children   works for the [employer]       Family History  Problem Relation Age of Onset  . Hypertension Mother     and GM  . Coronary artery disease Maternal Grandmother   . Stroke Maternal Grandmother   . Diabetes Other     aunt  . Colon cancer Neg Hx   . Prostate cancer Neg Hx     Review of Systems Exercise-very active at work, walks 6 or 7 hours a day. Diet--"could be better". No chest pain or shortness or breath No nausea, vomiting or diarrhea or blood in the stools No difficulty urinating. No anxiety depression    Objective:   Physical Exam BP 120/80  Pulse 84  Temp(Src) 98.6 F (37 C)  Ht 5' 10.8" (1.798 m)  Wt 170 lb 12.8 oz (77.474 kg)  BMI 23.96 kg/m2  SpO2 97% General -- alert, well-developed, NAD.  Neck --no thyromegaly Lungs -- normal respiratory effort, no intercostal retractions, no accessory muscle use, and normal breath sounds.  Heart-- normal rate, regular rhythm, no  murmur.  Abdomen-- Not distended, Good bowel sounds,soft, non-tender. Extremities-- no pretibial edema bilaterally  Neurologic-- alert & oriented X3. Speech, gait normal  Psych-- Cognition and judgment appear intact. Alert and cooperative with normal attention span and concentration. not anxious appearing and not depressed appearing.      Assessment & Plan:

## 2012-08-26 NOTE — Assessment & Plan Note (Signed)
Not an issue at this point 

## 2012-08-26 NOTE — Assessment & Plan Note (Addendum)
Doesn't have a EpiPen d/t cost, encouraged to carry one w/ him given history of a severe reaction, rx sent. Pt to call if need a letter of support to be sent to his insurance

## 2012-08-30 ENCOUNTER — Other Ambulatory Visit: Payer: Self-pay | Admitting: Internal Medicine

## 2012-08-31 ENCOUNTER — Other Ambulatory Visit: Payer: Self-pay | Admitting: *Deleted

## 2012-08-31 MED ORDER — AMLODIPINE BESYLATE 10 MG PO TABS: ORAL_TABLET | ORAL | Status: DC

## 2012-08-31 NOTE — Telephone Encounter (Signed)
Rx has been filled for Amlodipine 10 mg.  Ag cma

## 2012-11-12 ENCOUNTER — Other Ambulatory Visit: Payer: Self-pay

## 2013-01-09 ENCOUNTER — Other Ambulatory Visit: Payer: Self-pay | Admitting: Internal Medicine

## 2013-08-31 ENCOUNTER — Encounter: Payer: 59 | Admitting: Internal Medicine

## 2013-10-22 ENCOUNTER — Other Ambulatory Visit: Payer: Self-pay

## 2013-10-29 ENCOUNTER — Other Ambulatory Visit: Payer: Self-pay | Admitting: Internal Medicine

## 2013-11-10 ENCOUNTER — Ambulatory Visit (INDEPENDENT_AMBULATORY_CARE_PROVIDER_SITE_OTHER): Payer: Federal, State, Local not specified - PPO | Admitting: Internal Medicine

## 2013-11-10 ENCOUNTER — Encounter: Payer: Self-pay | Admitting: Internal Medicine

## 2013-11-10 VITALS — BP 122/84 | HR 89 | Temp 98.5°F | Ht 70.0 in | Wt 175.4 lb

## 2013-11-10 DIAGNOSIS — Z0000 Encounter for general adult medical examination without abnormal findings: Secondary | ICD-10-CM

## 2013-11-10 MED ORDER — AZITHROMYCIN 250 MG PO TABS: ORAL_TABLET | ORAL | Status: DC

## 2013-11-10 NOTE — Progress Notes (Signed)
Pre visit review using our clinic review tool, if applicable. No additional management support is needed unless otherwise documented below in the visit note. 

## 2013-11-10 NOTE — Progress Notes (Signed)
Subjective:    Patient ID: Christopher Lester, male    DOB: 06-27-1980, 33 y.o.   MRN: [id number]  DOS:  11/10/2013 Type of visit - description : cpx Interval history: In general feeling well. Developed cough 4 weeks ago, almost daily, initially dry, then he saw some sputum. Cough has improved a little in the last few days but is still there. Mild anterior chest pain, described as burning associated with cough. Mild wheezing with the onset of symptoms?  ROS No F/C No   SOB. No palpitations, no lower extremity edema Denies  nausea, vomiting diarrhea, blood in the stools No GERD  Sx.  No dysuria, gross hematuria, difficulty urinating   No anxiety, depression    Past Medical History  Diagnosis Date  . Asthma     as a child  . Anxiety   . 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 HTN (hypertension)     started meds 2010  . 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 Varicocele 04/24/2010    Varicocele diagnosed with a scrotal ultrasound, left greater than right     Past Surgical History  Procedure Laterality Date  . Wisdom tooth extraction  age 11   . Hernia repair Left 02-2012    History   Social History  . Marital Status: Married    Spouse Name: N/A    Number of Children:  1  . Years of Education: N/A   Occupational History  . works for the [place] [employer]   .     Social History Main Topics  . Smoking status: Never Smoker   . Smokeless tobacco: Never Used  . Alcohol Use: Yes     Comment: rarely  . Drug Use: No  . Sexual Activity: Not on file   Other Topics Concern  . Not on file   Social History Narrative   Married, 1 children   works for the [employer]         Family History  Problem Relation Age of Onset  . Hypertension Mother     and GM  . Coronary artery disease Maternal Grandmother   . Stroke Maternal Grandmother   . Diabetes Other     aunt  . Colon cancer Neg Hx   . Prostate cancer Neg Hx        Medication List       This list is accurate  as of: 11/10/13 11:59 PM.  Always use your most recent med list.               amLODipine 10 MG tablet  Commonly known as:  NORVASC  TAKE ONE (1) TABLET EACH DAY     azithromycin 250 MG tablet  Commonly known as:  ZITHROMAX Z-PAK  2 tabs a day the first day, then 1 tab a day x 4 days     cetirizine 10 MG tablet  Commonly known as:  ZYRTEC  Take 10 mg by mouth daily.     EPINEPHrine 0.3 mg/0.3 mL Soaj injection  Commonly known as:  EPIPEN  Inject 1 mL (1 mg total) into the muscle once.           Objective:   Physical Exam BP 122/84 mmHg  Pulse 89  Temp(Src) 98.5 F (36.9 C) (Oral)  Ht 5\' 10"  (1.778 m)  Wt 175 lb 6 oz (79.55 kg)  BMI 25.16 kg/m2  SpO2 97% General -- alert, well-developed, NAD.  Neck --no thyromegaly  HEENT-- Not pale.  Throat symmetric, no redness or discharge. Face symmetric, sinuses not tender to palpation. Nose not congested. Lungs -- normal respiratory effort, no intercostal retractions, no accessory muscle use, and normal breath sounds.  Heart-- normal rate, regular rhythm, no murmur.  Abdomen-- Not distended, good bowel sounds,soft, non-tender.  Extremities-- no pretibial edema bilaterally  Neurologic--  alert & oriented X3. Speech normal, gait appropriate for age, strength symmetric and appropriate for age.  Psych-- Cognition and judgment appear intact. Cooperative with normal attention span and concentration. No anxious or depressed appearing.      Assessment & Plan:   Cough for 4 weeks. Exam is benign, history of asthma as a child, had mild wheezing with the onset of symptoms but now lungs are clear. Atypical infection? Plan: Z-Pak, Mucinex, call if not better. Call if he develops wheezing.  Hypertension, BP excellent, recommend ambulatory BPs, call for refills when needed

## 2013-11-10 NOTE — Patient Instructions (Signed)
Get your blood work before you leave   Rest, fluids , tylenol If  cough, take Mucinex DM twice a day as needed  Take the antibiotic as prescribed  (zithromax ) Call if not gradually better over the next  10 days Call anytime if the symptoms are severe  Check the  blood pressure once  a month   Be sure your blood pressure is between  145/85  and 110/65.  if it is consistently higher or lower, let me know     Please come back to the office in 1 year  for a physical exam. Come back fasting     Testicular Self-Exam A self-examination of your testicles involves looking at and feeling your testicles for abnormal lumps or swelling. Several things can cause swelling, lumps, or pain in your testicles. Some of these causes are:  Injuries.  Inflammation.  Infection.  Accumulation of fluids around your testicle (hydrocele).  Twisted testicles (testicular torsion).  Testicular cancer. Self-examination of the testicles and groin areas may be advised if you are at risk for testicular cancer. Risks for testicular cancer include:  An undescended testicle (cryptorchidism).  A history of previous testicular cancer.  A family history of testicular cancer. The testicles are easiest to examine after warm baths or showers and are more difficult to examine when you are cold. This is because the muscles attached to the testicles retract and pull them up higher or into the abdomen. Follow these steps while you are standing:  Hold your penis away from your body.  Roll one testicle between your thumb and forefinger, feeling the entire testicle.  Roll the other testicle between your thumb and forefinger, feeling the entire testicle. Feel for lumps, swelling, or discomfort. A normal testicle is egg shaped and feels firm. It is smooth and not tender. The spermatic cord can be felt as a firm spaghetti-like cord at the back of your testicle. It is also important to examine the crease between the front  of your leg and your abdomen. Feel for any bumps that are tender. These could be enlarged lymph nodes.  Document Released: 04/01/2000 Document Revised: 08/26/2012 Document Reviewed: 06/15/2012 [facility] Patient Information 2015 [place], [place]. This information is not intended to replace advice given to you by your health care provider. Make sure you discuss any questions you have with your health care provider.

## 2013-11-10 NOTE — Assessment & Plan Note (Signed)
Td 4-09 Diet and exercise discussed Labs STE

## 2013-11-11 LAB — BASIC METABOLIC PANEL
BUN: 10 mg/dL (ref 6–23)
CALCIUM: 9.7 mg/dL (ref 8.4–10.5)
CO2: 26 mEq/L (ref 19–32)
Chloride: 104 mEq/L (ref 96–112)
Creatinine, Ser: 0.9 mg/dL (ref 0.4–1.5)
GFR: 107.74 mL/min (ref >60.00)
Glucose, Bld: 82 mg/dL (ref 70–99)
Potassium: 4 mEq/L (ref 3.5–5.1)
SODIUM: 138 meq/L (ref 135–145)

## 2013-11-11 LAB — LIPID PANEL
Cholesterol: 195 mg/dL (ref 0–200)
HDL: 35.7 mg/dL — AB (ref >39.00)
LDL Cholesterol: 141 mg/dL — ABNORMAL HIGH (ref 0–99)
NONHDL: 159.3
TRIGLYCERIDES: 91 mg/dL (ref 0.0–149.0)
Total CHOL/HDL Ratio: 5
VLDL: 18.2 mg/dL (ref 0.0–40.0)

## 2013-11-11 LAB — TSH: TSH: 0.31 u[IU]/mL — ABNORMAL LOW (ref 0.35–4.50)

## 2014-02-24 ENCOUNTER — Other Ambulatory Visit: Payer: Self-pay | Admitting: Internal Medicine

## 2014-04-07 ENCOUNTER — Telehealth: Payer: Self-pay | Admitting: Internal Medicine

## 2014-04-07 DIAGNOSIS — R7989 Other specified abnormal findings of blood chemistry: Secondary | ICD-10-CM

## 2014-04-07 NOTE — Telephone Encounter (Signed)
Please arrange the following: TSH, free T3, free T4 --- DX abnormal TSH

## 2014-04-07 NOTE — Telephone Encounter (Signed)
MyChart message sent to Pt informing him he is due for lab appt for TSH recheck, letter also printed and mailed to Pt. TSH, Free T3, and Free T4 ordered.

## 2014-05-13 ENCOUNTER — Other Ambulatory Visit (INDEPENDENT_AMBULATORY_CARE_PROVIDER_SITE_OTHER): Payer: Federal, State, Local not specified - PPO

## 2014-05-13 DIAGNOSIS — R7989 Other specified abnormal findings of blood chemistry: Secondary | ICD-10-CM | POA: Diagnosis not present

## 2014-05-13 LAB — T4, FREE: FREE T4: 1.02 ng/dL (ref 0.60–1.60)

## 2014-05-13 LAB — TSH: TSH: 0.53 u[IU]/mL (ref 0.35–4.50)

## 2014-05-13 LAB — T3, FREE: T3 FREE: 4.1 pg/mL (ref 2.3–4.2)

## 2014-07-18 ENCOUNTER — Ambulatory Visit (INDEPENDENT_AMBULATORY_CARE_PROVIDER_SITE_OTHER): Payer: Federal, State, Local not specified - PPO

## 2014-07-18 ENCOUNTER — Ambulatory Visit (INDEPENDENT_AMBULATORY_CARE_PROVIDER_SITE_OTHER): Payer: Federal, State, Local not specified - PPO | Admitting: Emergency Medicine

## 2014-07-18 VITALS — BP 140/82 | HR 115 | Temp 99.6°F | Resp 20 | Ht 69.0 in | Wt 181.2 lb

## 2014-07-18 DIAGNOSIS — R05 Cough: Secondary | ICD-10-CM

## 2014-07-18 DIAGNOSIS — J189 Pneumonia, unspecified organism: Secondary | ICD-10-CM

## 2014-07-18 DIAGNOSIS — R059 Cough, unspecified: Secondary | ICD-10-CM

## 2014-07-18 MED ORDER — AZITHROMYCIN 250 MG PO TABS: ORAL_TABLET | ORAL | Status: DC

## 2014-07-18 MED ORDER — PROMETHAZINE-CODEINE 6.25-10 MG/5ML PO SYRP: 5.0000 mL | ORAL_SOLUTION | ORAL | Status: DC | PRN

## 2014-07-18 NOTE — Progress Notes (Signed)
Subjective:  Patient ID: Christopher Lester, male    DOB: April 29, 1980  Age: 34 y.o. MRN: [id number]  CC: Fever; Headache; Cough; and Neck Pain   HPI LIBORIO SACCENTE presents  with a cough and fever. He says he had a cough up 202 he's got an essentially nonproductive cough has no nasal congestion but has some postnasal drainage that is mucoid in color. Has no sore throat. No nausea vomiting. No shortness breath or wheezing. He's had difficulty sleeping due to coughing. He's had no improvement with over-the-counter medication. He is nonsmoker. And has no history of lung disease.  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   He has past surgical history that includes Wisdom tooth extraction (age 63 ) and Hernia repair (Left, 02-2012).   His  family history includes Coronary artery disease in his maternal grandmother; Diabetes in his other; Hyperlipidemia in his maternal grandmother; Hypertension in his father, maternal grandmother, and mother; Stroke in his maternal grandmother. There is no history of Colon cancer or Prostate cancer.  He   reports that he has never smoked. He has never used smokeless tobacco. He reports that he drinks alcohol. He reports that he does not use illicit drugs.  Outpatient Prescriptions Prior to Visit  Medication Sig Dispense Refill  . amLODipine (NORVASC) 10 MG tablet TAKE ONE (1) TABLET EACH DAY 30 tablet 6  . cetirizine (ZYRTEC) 10 MG tablet Take 10 mg by mouth daily.      Marland Kitchen EPINEPHrine (EPIPEN) 0.3 mg/0.3 mL SOAJ injection Inject 1 mL (1 mg total) into the muscle once. 1 Device 3  . azithromycin (ZITHROMAX Z-PAK) 250 MG tablet 2 tabs a day the first day, then 1 tab a day x 4 days 6 tablet 0   No facility-administered medications prior to visit.    History   Social History  . Marital Status: Married    Spouse Name: N/A  . Number  of Children:  1  . Years of Education: N/A   Occupational History  . works for the [place] [employer]   .     Social History Main Topics  . Smoking status: Never Smoker   . Smokeless tobacco: Never Used  . Alcohol Use: Yes     Comment: rarely  . Drug Use: No  . Sexual Activity: Not on file   Other Topics Concern  . None   Social History Narrative   Married, 1 children   works for the [employer]         Review of Systems  Constitutional: Positive for fever, chills and fatigue. Negative for appetite change.  HENT: Positive for postnasal drip. Negative for congestion, ear pain, sinus pressure and sore throat.   Eyes: Negative for pain and redness.  Respiratory: Positive for cough. Negative for shortness of breath and wheezing.   Cardiovascular: Negative for leg swelling.  Gastrointestinal: Negative for nausea, vomiting, abdominal pain, diarrhea, constipation and blood in stool.  Endocrine: Negative for polyuria.  Genitourinary: Negative for dysuria, urgency, frequency and flank pain.  Musculoskeletal: Negative for gait problem.  Skin: Negative for rash.  Neurological: Negative for weakness and headaches.  Psychiatric/Behavioral: Negative for confusion and decreased concentration. The patient is not nervous/anxious.     Objective:  BP 140/82 mmHg  Pulse 115  Temp(Src) 99.6 F (37.6 C) (Oral)  Resp 20  Ht 5\' 9"  (1.753 m)  Wt 181 lb 4  oz (82.214 kg)  BMI 26.75 kg/m2  SpO2 98%  Physical Exam  Constitutional: He is oriented to person, place, and time. He appears well-developed and well-nourished. No distress.  HENT:  Head: Normocephalic and atraumatic.  Right Ear: External ear normal.  Left Ear: External ear normal.  Nose: Nose normal.  Eyes: Conjunctivae and EOM are normal. Pupils are equal, round, and reactive to light. No scleral icterus.  Neck: Normal range of motion. Neck supple. No tracheal deviation present.  Cardiovascular: Normal rate, regular rhythm and normal heart  sounds.   Pulmonary/Chest: Effort normal. No respiratory distress. He has no wheezes. He has no rales.  Abdominal: He exhibits no mass. There is no tenderness. There is no rebound and no guarding.  Musculoskeletal: He exhibits no edema.  Lymphadenopathy:    He has no cervical adenopathy.  Neurological: He is alert and oriented to person, place, and time. Coordination normal.  Skin: Skin is warm and dry. No rash noted.  Psychiatric: He has a normal mood and affect. His behavior is normal.      Assessment & Plan:   Christopher Lester was seen today for fever, headache, cough and neck pain.  Diagnoses and all orders for this visit:  Cough   I have discontinued Mr. Conry azithromycin. I am also having him maintain his cetirizine, EPINEPHrine, and amLODipine.  No orders of the defined types were placed in this encounter.    Appropriate red flag conditions were discussed with the patient as well as actions that should be taken.  Patient expressed his understanding.  Follow-up: No Follow-up on file.  Roselee Culver, MD    [facility] reading (PRIMARY) by  Dr. Ouida Sills.  LLL infiltrate.

## 2014-07-18 NOTE — Patient Instructions (Signed)

## 2014-08-23 ENCOUNTER — Ambulatory Visit (INDEPENDENT_AMBULATORY_CARE_PROVIDER_SITE_OTHER): Payer: Federal, State, Local not specified - PPO | Admitting: Family Medicine

## 2014-08-23 ENCOUNTER — Ambulatory Visit (INDEPENDENT_AMBULATORY_CARE_PROVIDER_SITE_OTHER): Payer: Federal, State, Local not specified - PPO

## 2014-08-23 VITALS — BP 128/80 | HR 90 | Temp 98.6°F | Resp 18 | Ht 71.0 in | Wt 183.6 lb

## 2014-08-23 DIAGNOSIS — J189 Pneumonia, unspecified organism: Secondary | ICD-10-CM

## 2014-08-23 NOTE — Progress Notes (Signed)
 Chief Complaint:  Chief Complaint  Patient presents with  . Follow-up    pnuemonia     HPI: Christopher Lester is a 34 y.o. male who reports to [facility] today complaining of  Here for CAP recheck. He has no symptoms. He has taken z pack and feels better. No asthma currently. No CP, SOB, wheezing, coughing   FINDINGS: Two views of the chest demonstrate an area of have increased since the airspace opacity in the lingula concerning for developing pneumonia. Clinical correlation and follow-up recommended. No pleural effusion, or pneumothorax. The osseous structures appear unremarkable. The cardiac silhouette is within normal limits.  IMPRESSION: Minimal lingular airspace haziness concerning for developing pneumonia. Clinical correlation and follow-up recommended.   Electronically Signed  By: Anner Crete M.D.  On: 07/18/2014 19:25  Past Medical History  Diagnosis Date  . Asthma     as a child  . Anxiety   . 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 HTN (hypertension)     started meds 2010  . 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 Varicocele 04/24/2010    Varicocele diagnosed with a scrotal ultrasound, left greater than right    Past Surgical History  Procedure Laterality Date  . Wisdom tooth extraction  age 15   . Hernia repair Left 02-2012   Social History   Social History  . Marital Status: Married    Spouse Name: N/A  . Number of Children:  1  . Years of Education: N/A   Occupational History  . works for the [place] [employer]   .     Social History Main Topics  . Smoking status: Never Smoker   . Smokeless tobacco: Never Used  . Alcohol Use: Yes     Comment: rarely  . Drug Use: No  . Sexual Activity: Not Asked   Other Topics Concern  . None   Social History Narrative   Married, 1 children   works for the [employer]       Family History  Problem Relation Age of Onset  . Hypertension Mother     and GM  . Coronary artery disease Maternal  Grandmother   . Stroke Maternal Grandmother   . Hyperlipidemia Maternal Grandmother   . Hypertension Maternal Grandmother   . Diabetes Other     aunt  . Colon cancer Neg Hx   . Prostate cancer Neg Hx   . Hypertension Father    No Known Allergies Prior to Admission medications   Medication Sig Start Date End Date Taking? Authorizing Provider  amLODipine (NORVASC) 10 MG tablet TAKE ONE (1) TABLET EACH DAY 02/24/14  Yes Colon Branch, MD  azithromycin (ZITHROMAX) 250 MG tablet Take 2 tabs PO x 1 dose, then 1 tab PO QD x 4 days 07/18/14  Yes Roselee Culver, MD  cetirizine (ZYRTEC) 10 MG tablet Take 10 mg by mouth daily.     Yes Historical Provider, MD  EPINEPHrine (EPIPEN) 0.3 mg/0.3 mL SOAJ injection Inject 1 mL (1 mg total) into the muscle once. Patient not taking: Reported on 08/23/2014 08/26/12   Colon Branch, MD  promethazine-codeine [facility] WITH CODEINE) 6.25-10 MG/5ML syrup Take 5-10 mLs by mouth every 4 (four) hours as needed for cough. Patient not taking: Reported on 08/23/2014 07/18/14   Roselee Culver, MD     ROS: The patient denies fevers, chills, night sweats, unintentional weight loss, chest pain, palpitations, wheezing, dyspnea on exertion, nausea,  vomiting, abdominal pain, dysuria, hematuria, melena, numbness, weakness, or tingling.   All other systems have been reviewed and were otherwise negative with the exception of those mentioned in the HPI and as above.    PHYSICAL EXAM: Filed Vitals:   08/23/14 0840  BP: 128/80  Pulse: 90  Temp: 98.6 F (37 C)  Resp: 18   Body mass index is 25.62 kg/(m^2).   General: Alert, no acute distress HEENT:  Normocephalic, atraumatic, oropharynx patent. EOMI, PERRLA Cardiovascular:  Regular rate and rhythm, no rubs murmurs or gallops.  No Carotid bruits, radial pulse intact. No pedal edema.  Respiratory: Clear to auscultation bilaterally.  No wheezes, rales, or rhonchi.  No cyanosis, no use of accessory musculature Abdominal: No  organomegaly, abdomen is soft and non-tender, positive bowel sounds. No masses. Skin: No rashes. Neurologic: Facial musculature symmetric. Psychiatric: Patient acts appropriately throughout our interaction. Lymphatic: No cervical or submandibular lymphadenopathy Musculoskeletal: Gait intact. No edema, tenderness   LABS: Results for orders placed or performed in visit on 05/13/14  TSH  Result Value Ref Range   TSH 0.53 0.35 - 4.50 uIU/mL  T3, free  Result Value Ref Range   T3, Free 4.1 2.3 - 4.2 pg/mL  T4, free  Result Value Ref Range   Free T4 1.02 0.60 - 1.60 ng/dL     EKG/XRAY:   Primary read interpreted by Dr. Marin Comment at [facility]. Lingula haziness resolved   ASSESSMENT/PLAN: Encounter Diagnosis  Name Primary?  . CAP (community acquired pneumonia) Yes   Resolved CAP Xray was normal FU prn    Gross sideeffects, risk and benefits, and alternatives of medications d/w patient. Patient is aware that all medications have potential sideeffects and we are unable to predict every sideeffect or drug-drug interaction that may occur.    DO  08/23/2014 9:50 AM

## 2014-10-15 ENCOUNTER — Other Ambulatory Visit: Payer: Self-pay | Admitting: Internal Medicine

## 2014-10-24 ENCOUNTER — Telehealth: Payer: Self-pay | Admitting: Internal Medicine

## 2014-10-24 ENCOUNTER — Other Ambulatory Visit: Payer: Self-pay

## 2014-10-24 MED ORDER — AMLODIPINE BESYLATE 10 MG PO TABS: 10.0000 mg | ORAL_TABLET | Freq: Every day | ORAL | Status: DC

## 2014-10-24 NOTE — Telephone Encounter (Signed)
Caller name: Tremon Sainvil  Relationship to patient: Self   Can be reached: [phone]  Pharmacy: [place], [place] - [address] [place]  Reason for call: Pt says that he went for a refill on his amLODipine at the pharmacy and was told that the Rx was denied by provider. Pt request a call back to discuss further to get his refill. Pt says that he only have 2 pills left.    Please advise pt.

## 2014-10-24 NOTE — Telephone Encounter (Signed)
Refills declined by accident, refills sent to [facility].

## 2014-11-14 ENCOUNTER — Encounter: Payer: Self-pay | Admitting: Behavioral Health

## 2014-11-14 ENCOUNTER — Telehealth: Payer: Self-pay | Admitting: Behavioral Health

## 2014-11-14 NOTE — Telephone Encounter (Signed)
Pre-Visit Call completed with patient and chart updated.   Pre-Visit Info documented in Specialty Comments under [employer].    

## 2014-11-15 ENCOUNTER — Ambulatory Visit (INDEPENDENT_AMBULATORY_CARE_PROVIDER_SITE_OTHER): Payer: Federal, State, Local not specified - PPO | Admitting: Internal Medicine

## 2014-11-15 ENCOUNTER — Encounter: Payer: Self-pay | Admitting: Internal Medicine

## 2014-11-15 VITALS — BP 118/78 | HR 74 | Temp 98.0°F | Ht 71.0 in | Wt 181.5 lb

## 2014-11-15 DIAGNOSIS — Z09 Encounter for follow-up examination after completed treatment for conditions other than malignant neoplasm: Secondary | ICD-10-CM

## 2014-11-15 DIAGNOSIS — Z0000 Encounter for general adult medical examination without abnormal findings: Secondary | ICD-10-CM

## 2014-11-15 LAB — BASIC METABOLIC PANEL
BUN: 8 mg/dL (ref 6–23)
CHLORIDE: 103 meq/L (ref 96–112)
CO2: 28 mEq/L (ref 19–32)
Calcium: 9.8 mg/dL (ref 8.4–10.5)
Creatinine, Ser: 0.8 mg/dL (ref 0.40–1.50)
GFR: 117.96 mL/min (ref >60.00)
Glucose, Bld: 95 mg/dL (ref 70–99)
POTASSIUM: 4 meq/L (ref 3.5–5.1)
Sodium: 140 mEq/L (ref 135–145)

## 2014-11-15 LAB — CBC WITH DIFFERENTIAL/PLATELET
BASOS ABS: 0 10*3/uL (ref 0.0–0.1)
BASOS PCT: 0.7 % (ref 0.0–3.0)
EOS ABS: 0.2 10*3/uL (ref 0.0–0.7)
Eosinophils Relative: 2.6 % (ref 0.0–5.0)
HEMATOCRIT: 50.1 % (ref 39.0–52.0)
HEMOGLOBIN: 16.9 g/dL (ref 13.0–17.0)
LYMPHS PCT: 28.4 % (ref 12.0–46.0)
Lymphs Abs: 1.8 10*3/uL (ref 0.7–4.0)
MCHC: 33.7 g/dL (ref 30.0–36.0)
MCV: 85.7 fl (ref 78.0–100.0)
Monocytes Absolute: 0.6 10*3/uL (ref 0.1–1.0)
Monocytes Relative: 8.9 % (ref 3.0–12.0)
Neutro Abs: 3.8 10*3/uL (ref 1.4–7.7)
Neutrophils Relative %: 59.4 % (ref 43.0–77.0)
Platelets: 315 10*3/uL (ref 150.0–400.0)
RBC: 5.84 Mil/uL — ABNORMAL HIGH (ref 4.22–5.81)
RDW: 13.1 % (ref 11.5–15.5)
WBC: 6.4 10*3/uL (ref 4.0–10.5)

## 2014-11-15 LAB — LIPID PANEL
CHOLESTEROL: 152 mg/dL (ref 0–200)
HDL: 35.5 mg/dL — ABNORMAL LOW (ref >39.00)
LDL CALC: 101 mg/dL — AB (ref 0–99)
NonHDL: 116.94
TRIGLYCERIDES: 78 mg/dL (ref 0.0–149.0)
Total CHOL/HDL Ratio: 4
VLDL: 15.6 mg/dL (ref 0.0–40.0)

## 2014-11-15 LAB — TSH: TSH: 0.63 u[IU]/mL (ref 0.35–4.50)

## 2014-11-15 MED ORDER — EPINEPHRINE 0.3 MG/0.3ML IJ SOAJ: 1.0000 mg | Freq: Once | INTRAMUSCULAR | Status: DC

## 2014-11-15 NOTE — Assessment & Plan Note (Addendum)
Td 4-09 Diet and exercise : Doing great Labs STE discussed again

## 2014-11-15 NOTE — Assessment & Plan Note (Signed)
HTN: Seems to be doing well. Asthma: Essentially asymptomatic, on no medications Bee sting allergies: EpiPen refill, recommend avoidance RTC one year

## 2014-11-15 NOTE — Patient Instructions (Signed)
Get your blood work before you leave      Check the  blood pressure  monthly   Be sure your blood pressure is between 110/65 and  145/85.  if it is consistently higher or lower, let me know   Next visit  for a physical exam in one year    Please schedule an appointment at the front desk Please come back fasting

## 2014-11-15 NOTE — Progress Notes (Signed)
Subjective:    Patient ID: Christopher Lester, male    DOB: 19-Nov-1980, 34 y.o.   MRN: [id number]  DOS:  11/15/2014 Type of visit - description : CPX Interval history: Doing well, no concerns. He remains very active    Review of Systems  Constitutional: No fever. No chills. No unexplained wt changes. No unusual sweats  HEENT: No dental problems, no ear discharge, no facial swelling, no voice changes. No eye discharge, no eye  redness , no  intolerance to light   Respiratory: No wheezing , no  difficulty breathing. No cough , no mucus production  Cardiovascular: No CP, no leg swelling , no  Palpitations  GI: no nausea, no vomiting, no diarrhea , no  abdominal pain.  No blood in the stools. No dysphagia, no odynophagia    Endocrine: No polyphagia, no polyuria , no polydipsia  GU: No dysuria, gross hematuria, difficulty urinating. No urinary urgency, no frequency.  Musculoskeletal: No joint swellings or unusual aches or pains  Skin: No change in the color of the skin, palor , no  Rash  Allergic, immunologic: No environmental allergies , no  food allergies  Neurological: No dizziness no  syncope. No headaches. No diplopia, no slurred, no slurred speech, no motor deficits, no facial  Numbness  Hematological: No enlarged lymph nodes, no easy bruising , no unusual bleedings  Psychiatry: No suicidal ideas, no hallucinations, no beavior problems, no confusion.  No unusual/severe anxiety, no depression   Past Medical History  Diagnosis Date  . Asthma     as a child  . Anxiety   . 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 HTN (hypertension)     started meds 2010  . 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 Varicocele 04/24/2010    Varicocele diagnosed with a scrotal ultrasound, left greater than right     Past Surgical History  Procedure Laterality Date  . Wisdom tooth extraction  age 29   . Hernia repair Left 02-2012    Social History   Social History  . Marital  Status: Married    Spouse Name: N/A  . Number of Children:  1  . Years of Education: N/A   Occupational History  . works for the [place] [employer]   .     Social History Main Topics  . Smoking status: Never Smoker   . Smokeless tobacco: Never Used  . Alcohol Use: Yes     Comment: rarely  . Drug Use: No  . Sexual Activity: Not on file   Other Topics Concern  . Not on file   Social History Narrative   Married, 1 children   works for the [employer]         Family History  Problem Relation Age of Onset  . Hypertension Mother     and GM  . Coronary artery disease Maternal Grandmother   . Stroke Maternal Grandmother   . Hyperlipidemia Maternal Grandmother   . Hypertension Maternal Grandmother   . Diabetes Other     aunt  . Colon cancer Neg Hx   . Prostate cancer Neg Hx   . Hypertension Father       Medication List       This list is accurate as of: 11/15/14  5:46 PM.  Always use your most recent med list.               amLODipine 10 MG tablet  Commonly known as:  NORVASC  Take 1 tablet (10 mg total) by mouth daily.     cetirizine 10 MG tablet  Commonly known as:  ZYRTEC  Take 10 mg by mouth daily.     EPINEPHrine 0.3 mg/0.3 mL Soaj injection  Commonly known as:  EPI-PEN  Inject 1 mL (1 mg total) into the muscle once.           Objective:   Physical Exam BP 118/78 mmHg  Pulse 74  Temp(Src) 98 F (36.7 C) (Oral)  Ht 5\' 11"  (1.803 m)  Wt 181 lb 8 oz (82.328 kg)  BMI 25.33 kg/m2  SpO2 96% General:   Well developed, well nourished . NAD.  HEENT:  Normocephalic . Face symmetric, atraumatic Neck: No thyromegaly Lungs:  CTA B Normal respiratory effort, no intercostal retractions, no accessory muscle use. Heart: RRR,  no murmur.  no pretibial edema bilaterally  Abdomen:  Not distended, soft, non-tender. No rebound or rigidity. No mass,organomegaly Skin: Not pale. Not jaundice Neurologic:  alert & oriented X3.  Speech normal, gait appropriate for age and  unassisted Psych--  Cognition and judgment appear intact.  Cooperative with normal attention span and concentration.  Behavior appropriate. No anxious or depressed appearing.    Assessment & Plan:   Assessment> HTN started medications 2010 Severe reaction to bee sting Psoriasis, guttate dx  2012 Varicocele dx 2012 by [place] L>R h/o anxiety H/o asthma  Plan: HTN: Seems to be doing well. Asthma: Essentially asymptomatic, on no medications Bee sting allergies: EpiPen refill, recommend avoidance RTC one year

## 2014-11-15 NOTE — Progress Notes (Signed)
Pre visit review using our clinic review tool, if applicable. No additional management support is needed unless otherwise documented below in the visit note. 

## 2014-11-16 LAB — HIV ANTIBODY (ROUTINE TESTING W REFLEX): HIV 1&2 Ab, 4th Generation: NONREACTIVE

## 2015-03-14 ENCOUNTER — Other Ambulatory Visit: Payer: Self-pay | Admitting: Internal Medicine

## 2015-11-13 ENCOUNTER — Other Ambulatory Visit: Payer: Self-pay

## 2015-11-13 MED ORDER — AMLODIPINE BESYLATE 10 MG PO TABS: 10.0000 mg | ORAL_TABLET | Freq: Every day | ORAL | 1 refills | Status: DC

## 2015-11-20 ENCOUNTER — Encounter: Payer: Self-pay | Admitting: Internal Medicine

## 2015-11-20 ENCOUNTER — Ambulatory Visit (INDEPENDENT_AMBULATORY_CARE_PROVIDER_SITE_OTHER): Payer: Federal, State, Local not specified - PPO | Admitting: Internal Medicine

## 2015-11-20 DIAGNOSIS — Z0000 Encounter for general adult medical examination without abnormal findings: Secondary | ICD-10-CM

## 2015-11-20 LAB — BASIC METABOLIC PANEL
BUN: 11 mg/dL (ref 6–23)
CALCIUM: 9.6 mg/dL (ref 8.4–10.5)
CO2: 27 mEq/L (ref 19–32)
Chloride: 105 mEq/L (ref 96–112)
Creatinine, Ser: 0.83 mg/dL (ref 0.40–1.50)
GFR: 112.37 mL/min (ref >60.00)
Glucose, Bld: 107 mg/dL — ABNORMAL HIGH (ref 70–99)
POTASSIUM: 4 meq/L (ref 3.5–5.1)
SODIUM: 138 meq/L (ref 135–145)

## 2015-11-20 LAB — LIPID PANEL
Cholesterol: 158 mg/dL (ref 0–200)
HDL: 37.9 mg/dL — ABNORMAL LOW (ref >39.00)
LDL Cholesterol: 101 mg/dL — ABNORMAL HIGH (ref 0–99)
NONHDL: 119.79
Total CHOL/HDL Ratio: 4
Triglycerides: 94 mg/dL (ref 0.0–149.0)
VLDL: 18.8 mg/dL (ref 0.0–40.0)

## 2015-11-20 LAB — TSH: TSH: 0.79 u[IU]/mL (ref 0.35–4.50)

## 2015-11-20 MED ORDER — AMLODIPINE BESYLATE 10 MG PO TABS: 10.0000 mg | ORAL_TABLET | Freq: Every day | ORAL | 3 refills | Status: DC

## 2015-11-20 NOTE — Assessment & Plan Note (Addendum)
Td 4-09; declined flu shot  Diet and exercise : room for improvement, has gained some weight, counseled  Labs : FLP, BMP, TSH

## 2015-11-20 NOTE — Assessment & Plan Note (Signed)
Plan: HTN: On amlodipine, ambulatory BPs normal. BP today is very good. No change, RFs Psoriasis : sx usually exacerbated during the winter, not needing any management today. Plans to see dermatology if symptoms severe. RTC one year

## 2015-11-20 NOTE — Patient Instructions (Signed)
GO TO THE LAB : Get the blood work     GO TO THE FRONT DESK Schedule your next appointment for a  Physical exam in 1 year     Check the  blood pressure  monthly   Be sure your blood pressure is between 110/65 and  130/85. If it is consistently higher or lower, let me know

## 2015-11-20 NOTE — Progress Notes (Signed)
Pre visit review using our clinic review tool, if applicable. No additional management support is needed unless otherwise documented below in the visit note. 

## 2015-11-20 NOTE — Progress Notes (Signed)
Subjective:    Patient ID: Christopher Lester, male    DOB: [birth year], 35 y.o.   MRN: [id number]  DOS:  11/20/2015 Type of visit - description :  CPX Interval history: No major concerns    Review of Systems  Constitutional: No fever. No chills. No unexplained wt changes. No unusual sweats  HEENT: No dental problems, no ear discharge, no facial swelling, no voice changes. No eye discharge, no eye  redness , no  intolerance to light   Respiratory: No wheezing , no  difficulty breathing. No cough , no mucus production  Cardiovascular: No CP, no leg swelling , no  Palpitations  GI: no nausea, no vomiting, no diarrhea , no  abdominal pain.  No blood in the stools. No dysphagia, no odynophagia    Endocrine: No polyphagia, no polyuria , no polydipsia  GU: No dysuria, gross hematuria, difficulty urinating. No urinary urgency, no frequency.  Musculoskeletal: No joint swellings or unusual aches or pains  Skin: psoriasis exacerbated last winter   Allergic, immunologic: No environmental allergies , no  food allergies  Neurological: No dizziness no  syncope. No headaches. No diplopia, no slurred, no slurred speech, no motor deficits, no facial  Numbness  Hematological: No enlarged lymph nodes, no easy bruising , no unusual bleedings  Psychiatry: No suicidal ideas, no hallucinations, no beavior problems, no confusion.  No unusual/severe anxiety, no depression   Past Medical History:  Diagnosis Date  . 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 Anxiety   . Asthma    as a child  . 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 HTN (hypertension)    started meds 2010  . Varicocele 04/24/2010   Varicocele diagnosed with a scrotal ultrasound, left greater than right     Past Surgical History:  Procedure Laterality Date  . HERNIA REPAIR Left 02-2012  . WISDOM TOOTH EXTRACTION  age 11     Social History   Social History  . Marital status: Married    Spouse name: N/A  . Number  of children: 2  . Years of education: N/A   Occupational History  . works for the [place] [employer]   .  [employer]   Social History Main Topics  . Smoking status: Never Smoker  . Smokeless tobacco: Never Used  . Alcohol use Yes     Comment: rarely  . Drug use: No  . Sexual activity: Not on file   Other Topics Concern  . Not on file   Social History Narrative   Married, 2 children, 1 step son   works for the [employer]         Family History  Problem Relation Age of Onset  . Hypertension Mother     and GM  . Coronary artery disease Maternal Grandmother   . Stroke Maternal Grandmother   . Hyperlipidemia Maternal Grandmother   . Hypertension Maternal Grandmother   . Hypertension Father   . Diabetes Other     aunt  . Colon cancer Neg Hx   . Prostate cancer Neg Hx        Medication List       Accurate as of 11/20/15 10:10 AM. Always use your most recent med list.          amLODipine 10 MG tablet Commonly known as:  NORVASC Take 1 tablet (10 mg total) by mouth daily.   cetirizine 10 MG tablet Commonly known as:  ZYRTEC Take 10 mg by  mouth daily.   EPINEPHrine 0.3 mg/0.3 mL Soaj injection Commonly known as:  EPI-PEN Inject 1 mL (1 mg total) into the muscle once.          Objective:   Physical Exam BP 124/68 (BP Location: Left Arm, Patient Position: Sitting, Cuff Size: Normal)   Pulse 85   Temp 97.6 F (36.4 C) (Oral)   Resp 14   Ht 5\' 11"  (1.803 m)   Wt 188 lb 2 oz (85.3 kg)   SpO2 98%   BMI 26.24 kg/m  General:   Well developed, well nourished . NAD.  Neck: No  thyromegaly  HEENT:  Normocephalic . Face symmetric, atraumatic Lungs:  CTA B Normal respiratory effort, no intercostal retractions, no accessory muscle use. Heart: RRR,  no murmur.  No pretibial edema bilaterally  Abdomen:  Not distended, soft, non-tender. No rebound or rigidity.   Skin: Exposed areas without rash. Not pale. Not jaundice Neurologic:  alert & oriented X3.  Speech normal, gait  appropriate for age and unassisted Strength symmetric and appropriate for age.  Psych: Cognition and judgment appear intact.  Cooperative with normal attention span and concentration.  Behavior appropriate. No anxious or depressed appearing.     Assessment & Plan:   Assessment> HTN started medications 2010 Severe reaction to bee sting Psoriasis, guttate dx  2012 Varicocele dx 2012 by [place] L>R h/o anxiety H/o asthma  Plan: HTN: On amlodipine, ambulatory BPs normal. BP today is very good. No change, RFs Psoriasis : sx usually exacerbated during the winter, not needing any management today. Plans to see dermatology if symptoms severe. RTC one year

## 2015-11-29 IMAGING — CR DG CHEST 2V
2 series · 2 of 2 positions shown · non-contrast
Comparison: None.

CLINICAL DATA: 33-year-old male with cough and fever

EXAM:
CHEST  2 VIEW

[PA]
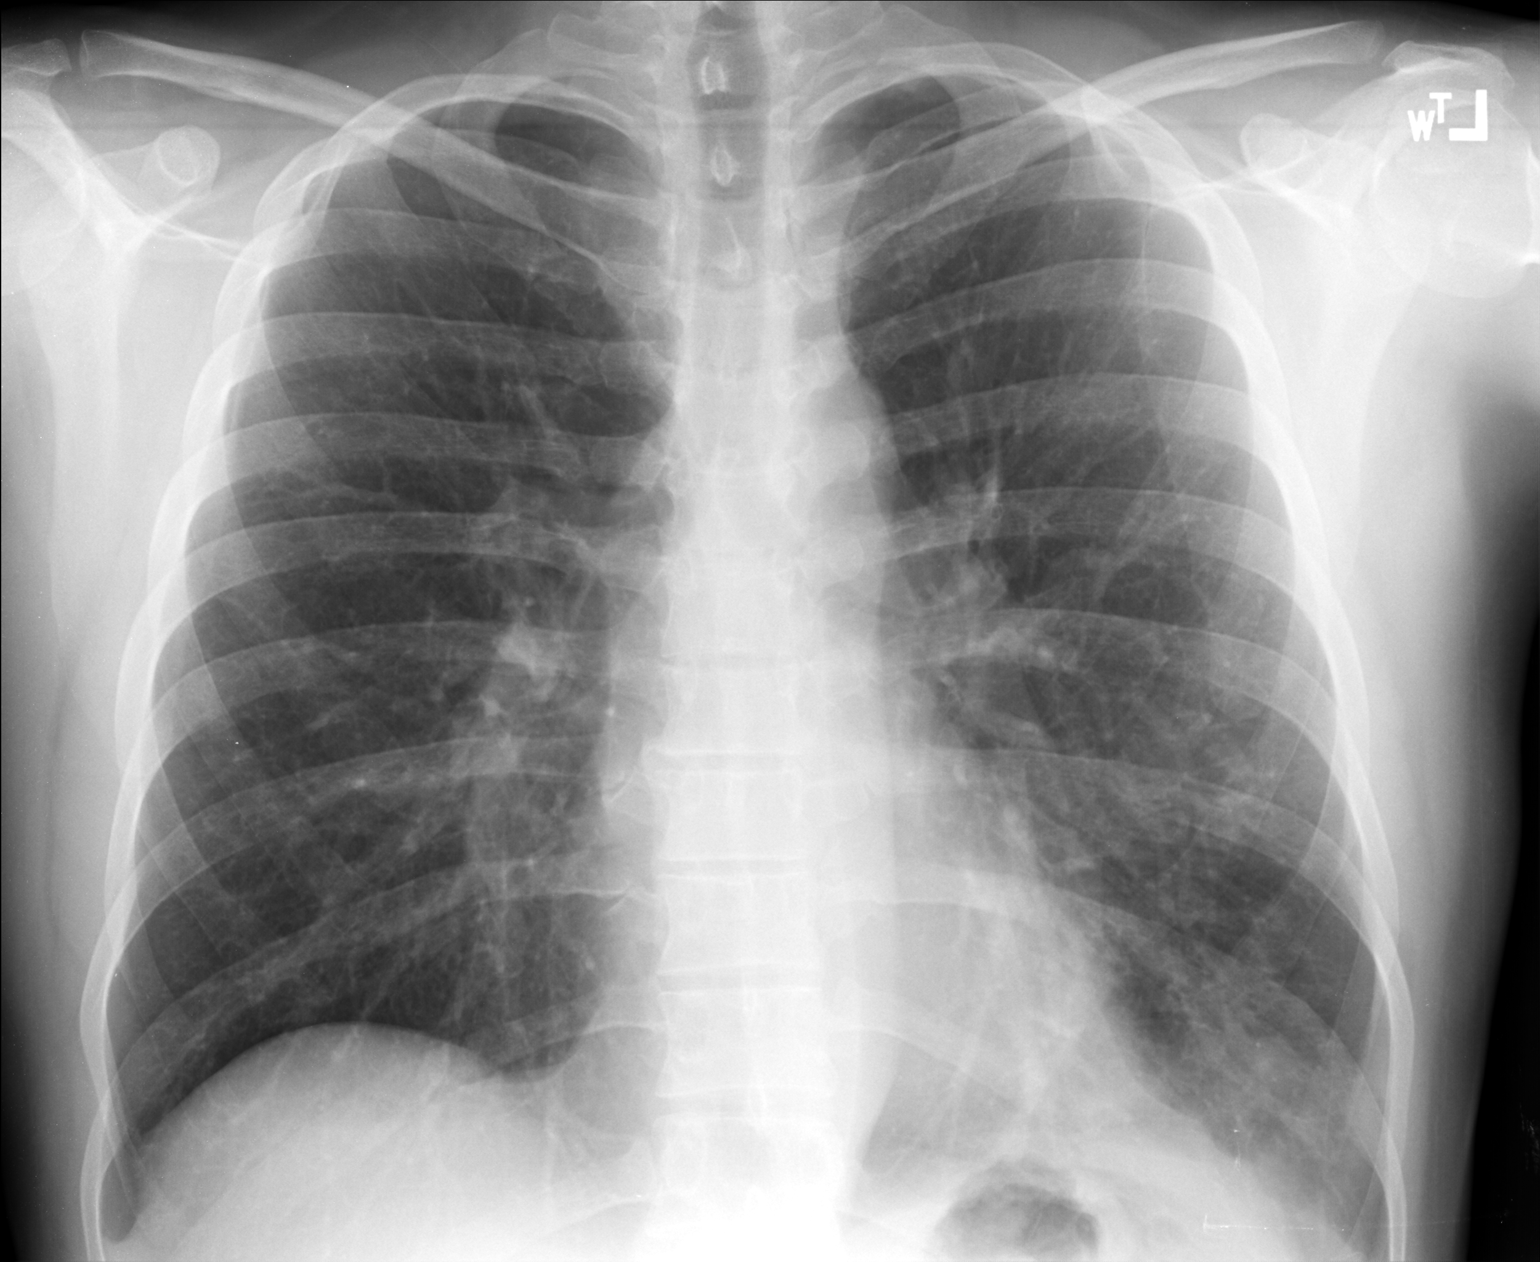

[lateral]
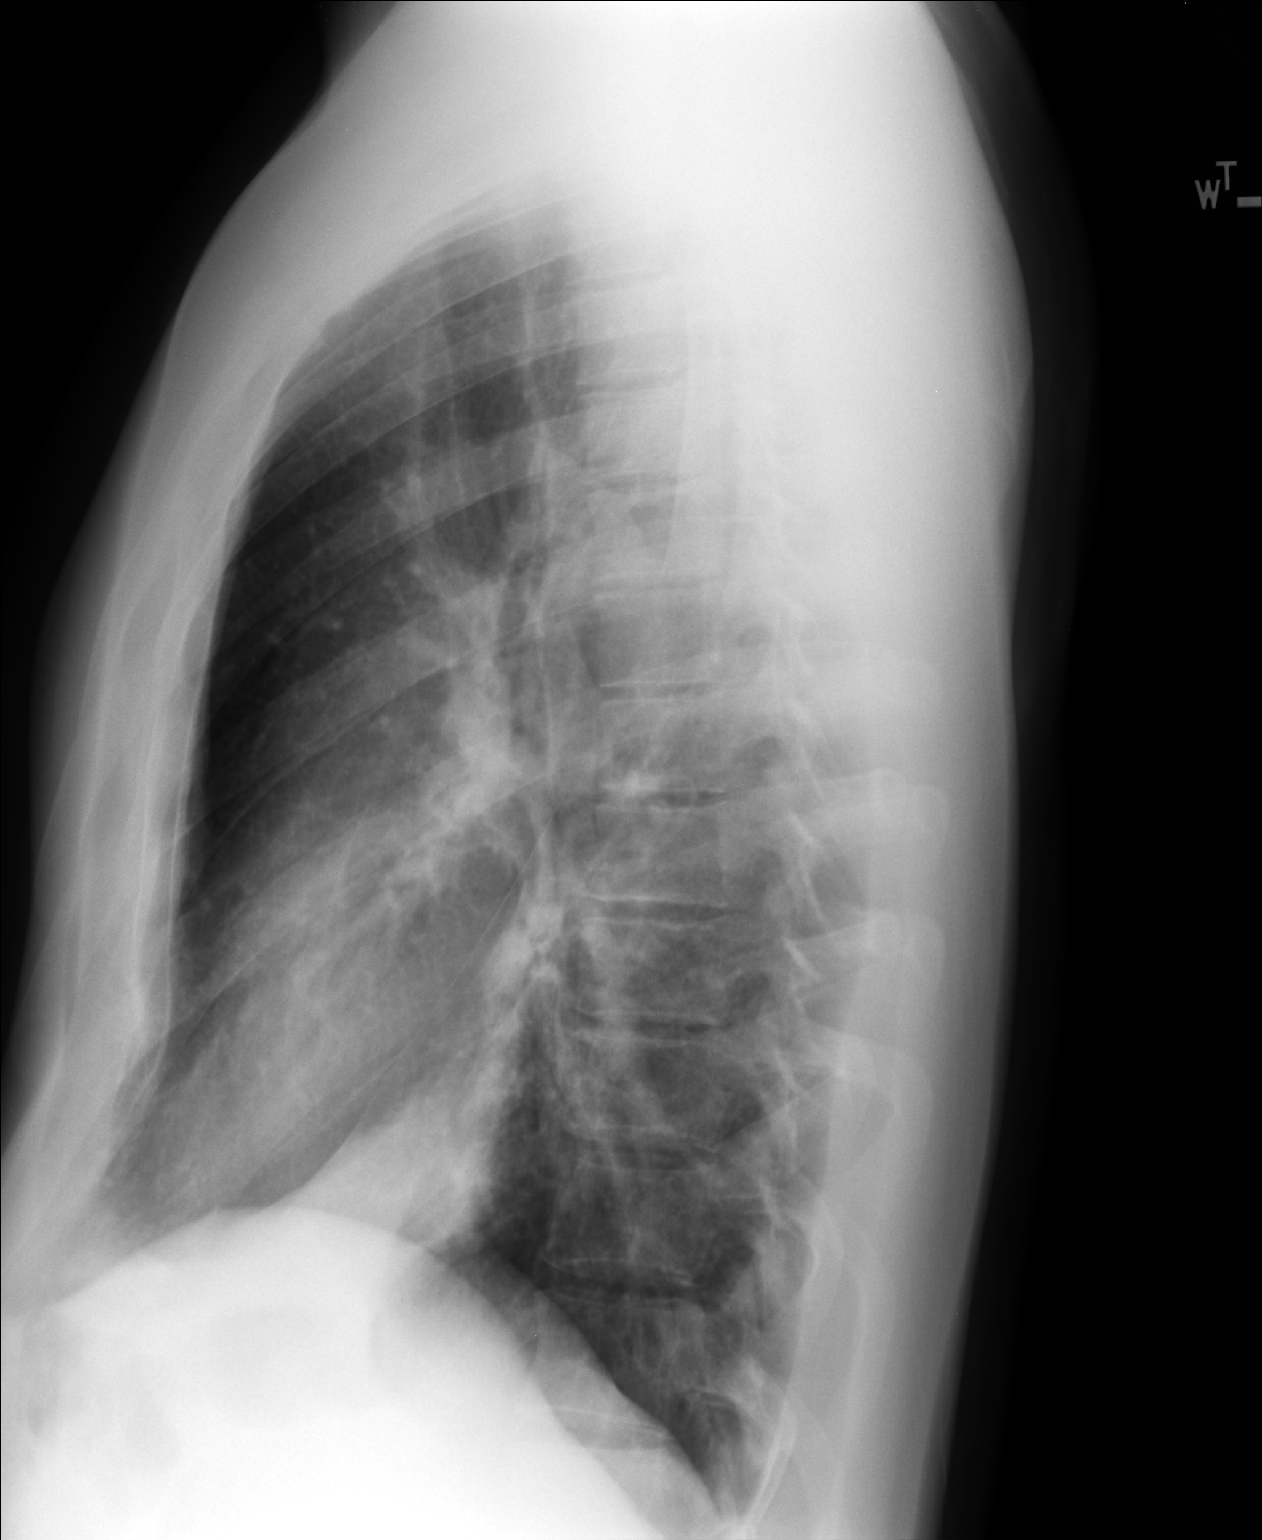

[2 of 2 positions shown; findings below may reference images not displayed]

FINDINGS: Two views of the chest demonstrate an area of have increased since
the airspace opacity in the lingula concerning for developing
pneumonia. Clinical correlation and follow-up recommended. No
pleural effusion, or pneumothorax. The osseous structures appear
unremarkable. The cardiac silhouette is within normal limits.
IMPRESSION: Minimal lingular airspace haziness concerning for developing
pneumonia. Clinical correlation and follow-up recommended.

## 2016-01-11 DIAGNOSIS — H16141 Punctate keratitis, right eye: Secondary | ICD-10-CM | POA: Diagnosis not present

## 2016-11-22 ENCOUNTER — Ambulatory Visit (INDEPENDENT_AMBULATORY_CARE_PROVIDER_SITE_OTHER): Payer: Federal, State, Local not specified - PPO | Admitting: Internal Medicine

## 2016-11-22 ENCOUNTER — Encounter: Payer: Self-pay | Admitting: Internal Medicine

## 2016-11-22 VITALS — BP 128/74 | HR 78 | Temp 98.1°F | Resp 14 | Ht 71.0 in | Wt 188.1 lb

## 2016-11-22 DIAGNOSIS — Z23 Encounter for immunization: Secondary | ICD-10-CM

## 2016-11-22 DIAGNOSIS — Z0000 Encounter for general adult medical examination without abnormal findings: Secondary | ICD-10-CM

## 2016-11-22 LAB — LIPID PANEL
CHOL/HDL RATIO: 4
Cholesterol: 155 mg/dL (ref 0–200)
HDL: 37.2 mg/dL — AB (ref >39.00)
LDL CALC: 108 mg/dL — AB (ref 0–99)
NonHDL: 117.58
TRIGLYCERIDES: 47 mg/dL (ref 0.0–149.0)
VLDL: 9.4 mg/dL (ref 0.0–40.0)

## 2016-11-22 LAB — COMPREHENSIVE METABOLIC PANEL
ALBUMIN: 4.6 g/dL (ref 3.5–5.2)
ALK PHOS: 46 U/L (ref 39–117)
ALT: 29 U/L (ref 0–53)
AST: 18 U/L (ref 0–37)
BUN: 12 mg/dL (ref 6–23)
CALCIUM: 10.2 mg/dL (ref 8.4–10.5)
CHLORIDE: 102 meq/L (ref 96–112)
CO2: 31 mEq/L (ref 19–32)
Creatinine, Ser: 0.93 mg/dL (ref 0.40–1.50)
GFR: 97.97 mL/min (ref >60.00)
Glucose, Bld: 105 mg/dL — ABNORMAL HIGH (ref 70–99)
Potassium: 4.3 mEq/L (ref 3.5–5.1)
SODIUM: 139 meq/L (ref 135–145)
TOTAL PROTEIN: 7.1 g/dL (ref 6.0–8.3)
Total Bilirubin: 0.9 mg/dL (ref 0.2–1.2)

## 2016-11-22 LAB — CBC WITH DIFFERENTIAL/PLATELET
BASOS ABS: 0.1 10*3/uL (ref 0.0–0.1)
BASOS PCT: 1.1 % (ref 0.0–3.0)
Eosinophils Absolute: 0.3 10*3/uL (ref 0.0–0.7)
Eosinophils Relative: 5.7 % — ABNORMAL HIGH (ref 0.0–5.0)
HEMATOCRIT: 49.2 % (ref 39.0–52.0)
HEMOGLOBIN: 16.7 g/dL (ref 13.0–17.0)
LYMPHS ABS: 1.6 10*3/uL (ref 0.7–4.0)
Lymphocytes Relative: 31 % (ref 12.0–46.0)
MCHC: 34 g/dL (ref 30.0–36.0)
MCV: 86.5 fl (ref 78.0–100.0)
MONOS PCT: 9.5 % (ref 3.0–12.0)
Monocytes Absolute: 0.5 10*3/uL (ref 0.1–1.0)
NEUTROS ABS: 2.8 10*3/uL (ref 1.4–7.7)
Neutrophils Relative %: 52.7 % (ref 43.0–77.0)
Platelets: 293 10*3/uL (ref 150.0–400.0)
RBC: 5.69 Mil/uL (ref 4.22–5.81)
RDW: 13.1 % (ref 11.5–15.5)
WBC: 5.3 10*3/uL (ref 4.0–10.5)

## 2016-11-22 LAB — HEMOGLOBIN A1C: Hgb A1c MFr Bld: 5.6 % (ref 4.6–6.5)

## 2016-11-22 MED ORDER — EPINEPHRINE 0.3 MG/0.3ML IJ SOAJ: 1.0000 mg | Freq: Once | INTRAMUSCULAR | 1 refills | Status: DC | PRN

## 2016-11-22 NOTE — Assessment & Plan Note (Signed)
HTN: On amlodipine, feels well, no ambulatory BPs; today BP is okay.  RF p.r.n. , check ambulatory BPs recommended Severe reaction to beestings: Recommend to carry a EpiPen RTC 1 year

## 2016-11-22 NOTE — Progress Notes (Signed)
Subjective:    Patient ID: Christopher Lester, male    DOB: 26-Aug-1980, 36 y.o.   MRN: [id number]  DOS:  11/22/2016 Type of visit - description : CPX Interval history: No concerns, good compliance with BP meds, no ambulatory BPs   Review of Systems A 14 point review of systems is negative    Past Medical History:  Diagnosis Date  . 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 Anxiety   . Asthma    as a child  . 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 HTN (hypertension)    started meds 2010  . Varicocele 04/24/2010   Varicocele diagnosed with a scrotal ultrasound, left greater than right     Past Surgical History:  Procedure Laterality Date  . HERNIA REPAIR Left 02-2012  . WISDOM TOOTH EXTRACTION  age 53     Social History   Socioeconomic History  . Marital status: Married    Spouse name: Not on file  . Number of children: 2  . Years of education: Not on file  . Highest education level: Not on file  Social Needs  . Financial resource strain: Not on file  . Food insecurity - worry: Not on file  . Food insecurity - inability: Not on file  . Transportation needs - medical: Not on file  . Transportation needs - non-medical: Not on file  Occupational History  . Occupation: works for the [place] [employer]    Employer: [employer]  Tobacco Use  . Smoking status: Never Smoker  . Smokeless tobacco: Never Used  Substance and Sexual Activity  . Alcohol use: Yes    Comment: rarely  . Drug use: No  . Sexual activity: Not on file  Other Topics Concern  . Not on file  Social History Narrative   Married, 2 children, 1 step son   works for the [employer]         Family History  Problem Relation Age of Onset  . Hypertension Mother        and GM  . Coronary artery disease Maternal Grandmother   . Stroke Maternal Grandmother   . Hyperlipidemia Maternal Grandmother   . Hypertension Maternal Grandmother   . Hypertension Father   . Stroke Father 69  . Diabetes Other        aunt  .  Colon cancer Neg Hx   . Prostate cancer Neg Hx      Allergies as of 11/22/2016   No Known Allergies     Medication List        Accurate as of 11/22/16  5:46 PM. Always use your most recent med list.          amLODipine 10 MG tablet Commonly known as:  NORVASC Take 1 tablet (10 mg total) by mouth daily.   cetirizine 10 MG tablet Commonly known as:  ZYRTEC Take 10 mg by mouth daily.   EPINEPHrine 0.3 mg/0.3 mL Soaj injection Commonly known as:  EPI-PEN Inject 1 mL (1 mg total) once as needed for up to 1 dose into the muscle.          Objective:   Physical Exam BP 128/74 (BP Location: Right Arm, Patient Position: Sitting, Cuff Size: Small)   Pulse 78   Temp 98.1 F (36.7 C) (Oral)   Resp 14   Ht 5\' 11"  (1.803 m)   Wt 188 lb 2 oz (85.3 kg)   SpO2 98%   BMI 26.24 kg/m  General:   Well developed, well nourished . NAD.  Neck: No  thyromegaly  HEENT:  Normocephalic . Face symmetric, atraumatic Lungs:  CTA B Normal respiratory effort, no intercostal retractions, no accessory muscle use. Heart: RRR,  no murmur.  No pretibial edema bilaterally  Abdomen:  Not distended, soft, non-tender. No rebound or rigidity.   Skin: Exposed areas without rash. Not pale. Not jaundice Neurologic:  alert & oriented X3.  Speech normal, gait appropriate for age and unassisted Strength symmetric and appropriate for age.  Psych: Cognition and judgment appear intact.  Cooperative with normal attention span and concentration.  Behavior appropriate. No anxious or depressed appearing.     Assessment & Plan:    Assessment> HTN started medications 2010 Severe reaction to bee sting Psoriasis, guttate dx  2012 Varicocele dx 2012 by [place] L>R h/o anxiety H/o asthma  Plan: HTN: On amlodipine, feels well, no ambulatory BPs; today BP is okay.  RF p.r.n. , check ambulatory BPs recommended Severe reaction to beestings: Recommend to carry a EpiPen RTC 1 year

## 2016-11-22 NOTE — Assessment & Plan Note (Signed)
-  Td 11/2016.  Flu shot today -Diet and exercise: works for the [place] [employer], walks 5 miles a day.  Recommend healthy diet -Laboratory: CMP, FLP, CBC, A1c (CBG slightly elevated last year, patient concerned)

## 2016-11-22 NOTE — Patient Instructions (Signed)
GO TO THE LAB : Get the blood work     GO TO THE FRONT DESK Schedule your next appointment for a physical exam in 1 year   Check the  blood pressure  monthly   Be sure your blood pressure is between 110/65 and  135/85. If it is consistently higher or lower, let me know    

## 2016-11-22 NOTE — Progress Notes (Signed)
cPre visit review using our clinic review tool, if applicable. No additional management support is needed unless otherwise documented below in the visit note.  

## 2016-12-21 ENCOUNTER — Other Ambulatory Visit: Payer: Self-pay | Admitting: Internal Medicine

## 2017-11-27 ENCOUNTER — Ambulatory Visit (INDEPENDENT_AMBULATORY_CARE_PROVIDER_SITE_OTHER): Payer: Federal, State, Local not specified - PPO | Admitting: Internal Medicine

## 2017-11-27 ENCOUNTER — Encounter: Payer: Self-pay | Admitting: Internal Medicine

## 2017-11-27 VITALS — BP 128/88 | HR 80 | Temp 97.4°F | Resp 16 | Ht 70.0 in | Wt 188.0 lb

## 2017-11-27 DIAGNOSIS — Z0000 Encounter for general adult medical examination without abnormal findings: Secondary | ICD-10-CM

## 2017-11-27 DIAGNOSIS — Z23 Encounter for immunization: Secondary | ICD-10-CM

## 2017-11-27 DIAGNOSIS — K080 Exfoliation of teeth due to systemic causes: Secondary | ICD-10-CM | POA: Diagnosis not present

## 2017-11-27 LAB — TSH: TSH: 0.69 u[IU]/mL (ref 0.35–4.50)

## 2017-11-27 LAB — BASIC METABOLIC PANEL
BUN: 14 mg/dL (ref 6–23)
CHLORIDE: 103 meq/L (ref 96–112)
CO2: 27 meq/L (ref 19–32)
CREATININE: 0.88 mg/dL (ref 0.40–1.50)
Calcium: 10 mg/dL (ref 8.4–10.5)
GFR: 103.83 mL/min (ref >60.00)
Glucose, Bld: 90 mg/dL (ref 70–99)
Potassium: 4.4 mEq/L (ref 3.5–5.1)
Sodium: 140 mEq/L (ref 135–145)

## 2017-11-27 LAB — HEMOGLOBIN A1C: Hgb A1c MFr Bld: 5.6 % (ref 4.6–6.5)

## 2017-11-27 MED ORDER — AMLODIPINE BESYLATE 10 MG PO TABS: 10.0000 mg | ORAL_TABLET | Freq: Every day | ORAL | 3 refills | Status: DC

## 2017-11-27 NOTE — Patient Instructions (Signed)
GO TO THE LAB : Get the blood work     GO TO THE FRONT DESK Schedule your next appointment for a  Physical in 1 year     Check the  blood pressure   monthly   Be sure your blood pressure is between 110/65 and  135/85. If it is consistently higher or lower, let me know

## 2017-11-27 NOTE — Assessment & Plan Note (Signed)
-  Td 11/2016.  Flu shot today -Diet and exercise: works for the [place] [employer], very active,  Diet discussed  -Laboratory: bmp a1c tsh

## 2017-11-27 NOTE — Assessment & Plan Note (Signed)
HTN: On amlodipine, ambulatory BPs 120/80 Allergic reaction to bee stings: Reports he has a EpiPen Psoriasis: Silent at this point Varicocele: Self-examination at baseline RTC 1 year

## 2017-11-27 NOTE — Progress Notes (Signed)
Subjective:    Patient ID: Christopher Lester, male    DOB: 02/18/1980, 37 y.o.   MRN: [id number]  DOS:  11/27/2017 Type of visit - description : cpx No concerns   Review of Systems  A 14 point review of systems is negative   Past Medical History:  Diagnosis Date  . 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 Anxiety   . Asthma    as a child  . 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 HTN (hypertension)    started meds 2010  . Varicocele 04/24/2010   Varicocele diagnosed with a scrotal ultrasound, left greater than right     Past Surgical History:  Procedure Laterality Date  . HERNIA REPAIR Left 02-2012  . WISDOM TOOTH EXTRACTION  age 75     Social History   Socioeconomic History  . Marital status: Married    Spouse name: Not on file  . Number of children: 2  . Years of education: Not on file  . Highest education level: Not on file  Occupational History  . Occupation: works for the [place] [employer]    Employer: [employer]  Social Needs  . Financial resource strain: Not on file  . Food insecurity:    Worry: Not on file    Inability: Not on file  . Transportation needs:    Medical: Not on file    Non-medical: Not on file  Tobacco Use  . Smoking status: Never Smoker  . Smokeless tobacco: Never Used  Substance and Sexual Activity  . Alcohol use: Yes    Comment: rarely  . Drug use: No  . Sexual activity: Not on file  Lifestyle  . Physical activity:    Days per week: Not on file    Minutes per session: Not on file  . Stress: Not on file  Relationships  . Social connections:    Talks on phone: Not on file    Gets together: Not on file    Attends religious service: Not on file    Active member of club or organization: Not on file    Attends meetings of clubs or organizations: Not on file    Relationship status: Not on file  . Intimate partner violence:    Fear of current or ex partner: Not on file    Emotionally abused: Not on file    Physically abused: Not on file      Forced sexual activity: Not on file  Other Topics Concern  . Not on file  Social History Narrative   Married, 2 children (boy 2011, girl 2012) 2   1 step son (2006)   works for the [employer]         Family History  Problem Relation Age of Onset  . Hypertension Mother        and GM  . Coronary artery disease Maternal Grandmother   . Stroke Maternal Grandmother   . Hyperlipidemia Maternal Grandmother   . Hypertension Maternal Grandmother   . Hypertension Father   . Stroke Father 72  . Diabetes Other        aunt  . Colon cancer Neg Hx   . Prostate cancer Neg Hx      Allergies as of 11/27/2017   No Known Allergies     Medication List        Accurate as of 11/27/17  5:31 PM. Always use your most recent med list.  amLODipine 10 MG tablet Commonly known as:  NORVASC Take 1 tablet (10 mg total) by mouth daily.   cetirizine 10 MG tablet Commonly known as:  ZYRTEC Take 10 mg by mouth daily.   EPINEPHrine 0.3 mg/0.3 mL Soaj injection Commonly known as:  EPI-PEN Inject 1 mL (1 mg total) once as needed for up to 1 dose into the muscle.           Objective:   Physical Exam BP 128/88 (BP Location: Left Arm, Patient Position: Sitting, Cuff Size: Normal)   Pulse 80   Temp (!) 97.4 F (36.3 C) (Oral)   Resp 16   Ht 5\' 10"  (1.778 m)   Wt 188 lb (85.3 kg)   SpO2 98%   BMI 26.98 kg/m  General: Well developed, NAD, BMI noted Neck: No  thyromegaly  HEENT:  Normocephalic . Face symmetric, atraumatic Lungs:  CTA B Normal respiratory effort, no intercostal retractions, no accessory muscle use. Heart: RRR,  no murmur.  No pretibial edema bilaterally  Abdomen:  Not distended, soft, non-tender. No rebound or rigidity.   Skin: Exposed areas without rash. Not pale. Not jaundice Neurologic:  alert & oriented X3.  Speech normal, gait appropriate for age and unassisted Strength symmetric and appropriate for age.  Psych: Cognition and judgment appear  intact.  Cooperative with normal attention span and concentration.  Behavior appropriate. No anxious or depressed appearing.     Assessment & Plan:    Assessment  HTN started medications 2010 Severe reaction to bee sting Psoriasis, guttate dx  2012 Varicocele dx 2012 by [place] L>R h/o anxiety H/o asthma  PLAN  HTN: On amlodipine, ambulatory BPs 120/80 Allergic reaction to bee stings: Reports he has a EpiPen Psoriasis: Silent at this point Varicocele: Self-examination at baseline RTC 1 year

## 2017-12-23 DIAGNOSIS — K080 Exfoliation of teeth due to systemic causes: Secondary | ICD-10-CM | POA: Diagnosis not present

## 2018-12-09 ENCOUNTER — Other Ambulatory Visit: Payer: Self-pay

## 2018-12-09 ENCOUNTER — Telehealth: Payer: Self-pay

## 2018-12-09 NOTE — Telephone Encounter (Signed)
Copied from [place] [id number]. Topic: General - Other >> Dec 09, 2018  9:48 AM Carolyn Stare wrote: Pt returned Kristi call, called the office no answer

## 2018-12-10 ENCOUNTER — Other Ambulatory Visit: Payer: Self-pay

## 2018-12-10 ENCOUNTER — Encounter: Payer: Self-pay | Admitting: Internal Medicine

## 2018-12-10 ENCOUNTER — Ambulatory Visit (INDEPENDENT_AMBULATORY_CARE_PROVIDER_SITE_OTHER): Payer: Federal, State, Local not specified - PPO | Admitting: Internal Medicine

## 2018-12-10 VITALS — BP 115/60 | HR 74 | Temp 96.9°F | Resp 16 | Ht 70.0 in | Wt 193.0 lb

## 2018-12-10 DIAGNOSIS — Z23 Encounter for immunization: Secondary | ICD-10-CM

## 2018-12-10 DIAGNOSIS — Z0000 Encounter for general adult medical examination without abnormal findings: Secondary | ICD-10-CM

## 2018-12-10 LAB — COMPREHENSIVE METABOLIC PANEL
ALT: 32 U/L (ref 0–53)
AST: 21 U/L (ref 0–37)
Albumin: 4.7 g/dL (ref 3.5–5.2)
Alkaline Phosphatase: 51 U/L (ref 39–117)
BUN: 12 mg/dL (ref 6–23)
CO2: 25 mEq/L (ref 19–32)
Calcium: 9.7 mg/dL (ref 8.4–10.5)
Chloride: 105 mEq/L (ref 96–112)
Creatinine, Ser: 0.81 mg/dL (ref 0.40–1.50)
GFR: 106.89 mL/min (ref >60.00)
Glucose, Bld: 98 mg/dL (ref 70–99)
Potassium: 4.3 mEq/L (ref 3.5–5.1)
Sodium: 139 mEq/L (ref 135–145)
Total Bilirubin: 1.2 mg/dL (ref 0.2–1.2)
Total Protein: 7 g/dL (ref 6.0–8.3)

## 2018-12-10 LAB — CBC WITH DIFFERENTIAL/PLATELET
Basophils Absolute: 0.1 10*3/uL (ref 0.0–0.1)
Basophils Relative: 1.2 % (ref 0.0–3.0)
Eosinophils Absolute: 0.4 10*3/uL (ref 0.0–0.7)
Eosinophils Relative: 6.8 % — ABNORMAL HIGH (ref 0.0–5.0)
HCT: 48.1 % (ref 39.0–52.0)
Hemoglobin: 16.6 g/dL (ref 13.0–17.0)
Lymphocytes Relative: 28.3 % (ref 12.0–46.0)
Lymphs Abs: 1.6 10*3/uL (ref 0.7–4.0)
MCHC: 34.6 g/dL (ref 30.0–36.0)
MCV: 85.4 fl (ref 78.0–100.0)
Monocytes Absolute: 0.5 10*3/uL (ref 0.1–1.0)
Monocytes Relative: 9.1 % (ref 3.0–12.0)
Neutro Abs: 3 10*3/uL (ref 1.4–7.7)
Neutrophils Relative %: 54.6 % (ref 43.0–77.0)
Platelets: 285 10*3/uL (ref 150.0–400.0)
RBC: 5.63 Mil/uL (ref 4.22–5.81)
RDW: 13.1 % (ref 11.5–15.5)
WBC: 5.5 10*3/uL (ref 4.0–10.5)

## 2018-12-10 LAB — LIPID PANEL
Cholesterol: 147 mg/dL (ref 0–200)
HDL: 37.3 mg/dL — ABNORMAL LOW (ref >39.00)
LDL Cholesterol: 96 mg/dL (ref 0–99)
NonHDL: 110.03
Total CHOL/HDL Ratio: 4
Triglycerides: 69 mg/dL (ref 0.0–149.0)
VLDL: 13.8 mg/dL (ref 0.0–40.0)

## 2018-12-10 LAB — HEMOGLOBIN A1C: Hgb A1c MFr Bld: 5.6 % (ref 4.6–6.5)

## 2018-12-10 NOTE — Progress Notes (Signed)
Subjective:    Patient ID: Christopher Lester, male    DOB: Dec 22, 1980, 38 y.o.   MRN: [id number]  DOS:  12/10/2018 Type of visit - description: CPX No concerns   Review of Systems  A 14 point review of systems is negative    Past Medical History:  Diagnosis Date  . 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 Anxiety   . Asthma    as a child  . 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 HTN (hypertension)    started meds 2010  . Varicocele 04/24/2010   Varicocele diagnosed with a scrotal ultrasound, left greater than right     Past Surgical History:  Procedure Laterality Date  . HERNIA REPAIR Left 02-2012  . WISDOM TOOTH EXTRACTION  age 10     Social History   Socioeconomic History  . Marital status: Married    Spouse name: Not on file  . Number of children: 2  . Years of education: Not on file  . Highest education level: Not on file  Occupational History  . Occupation: works for the [place] [employer]    Employer: [employer]  Social Needs  . Financial resource strain: Not on file  . Food insecurity    Worry: Not on file    Inability: Not on file  . Transportation needs    Medical: Not on file    Non-medical: Not on file  Tobacco Use  . Smoking status: Never Smoker  . Smokeless tobacco: Never Used  Substance and Sexual Activity  . Alcohol use: Yes    Comment: rarely  . Drug use: No  . Sexual activity: Not on file  Lifestyle  . Physical activity    Days per week: Not on file    Minutes per session: Not on file  . Stress: Not on file  Relationships  . Social Herbalist on phone: Not on file    Gets together: Not on file    Attends religious service: Not on file    Active member of club or organization: Not on file    Attends meetings of clubs or organizations: Not on file    Relationship status: Not on file  . Intimate partner violence    Fear of current or ex partner: Not on file    Emotionally abused: Not on file    Physically abused: Not on file   Forced sexual activity: Not on file  Other Topics Concern  . Not on file  Social History Narrative   Married, 2 children (boy 2011, girl 2012)     1 step son (2006)   works for the [employer]         Family History  Problem Relation Age of Onset  . Hypertension Mother        and GM  . Coronary artery disease Maternal Grandmother   . Stroke Maternal Grandmother   . Hyperlipidemia Maternal Grandmother   . Hypertension Maternal Grandmother   . Hypertension Father   . Stroke Father 7  . Diabetes Other        aunt  . Colon cancer Neg Hx   . Prostate cancer Neg Hx      Allergies as of 12/10/2018      Reactions   Bee Venom       Medication List       Accurate as of December 10, 2018 11:59 PM. If you have any questions, ask your nurse or  doctor.        amLODipine 10 MG tablet Commonly known as: NORVASC Take 1 tablet (10 mg total) by mouth daily.   cetirizine 10 MG tablet Commonly known as: ZYRTEC Take 10 mg by mouth daily.   EPINEPHrine 0.3 mg/0.3 mL Soaj injection Commonly known as: EPI-PEN Inject 1 mL (1 mg total) once as needed for up to 1 dose into the muscle.           Objective:   Physical Exam BP 115/60 (BP Location: Right Arm)   Pulse 74   Temp (!) 96.9 F (36.1 C) (Temporal)   Resp 16   Ht 5\' 10"  (1.778 m)   Wt 193 lb (87.5 kg)   SpO2 99%   BMI 27.69 kg/m  General: Well developed, NAD, BMI noted Neck: No  thyromegaly  HEENT:  Normocephalic . Face symmetric, atraumatic Lungs:  CTA B Normal respiratory effort, no intercostal retractions, no accessory muscle use. Heart: RRR,  no murmur.  No pretibial edema bilaterally  Abdomen:  Not distended, soft, non-tender. No rebound or rigidity.   Skin: Exposed areas without rash. Not pale. Not jaundice Neurologic:  alert & oriented X3.  Speech normal, gait appropriate for age and unassisted Strength symmetric and appropriate for age.  Psych: Cognition and judgment appear intact.  Cooperative with  normal attention span and concentration.  Behavior appropriate. No anxious or depressed appearing.     Assessment      Assessment  HTN started medications 2010 Severe reaction to bee sting Psoriasis, guttate dx  2012 Varicocele dx 2012 by [place] L>R h/o anxiety H/o asthma  PLAN  Here for CPX HTN: On amlodipine, no recent ambulatory BPs, BP was elevated upon arrival, I rechecked right arm: 115/60.  No change, recommend to check BPs at home once or twice a month. Bee sting allergies: Advised to check the expiration date on his EpiPen and call for refills if needed RTC 1 year   This visit occurred during the SARS-CoV-2 public health emergency.  Safety protocols were in place, including screening questions prior to the visit, additional usage of staff PPE, and extensive cleaning of exam room while observing appropriate contact time as indicated for disinfecting solutions.

## 2018-12-10 NOTE — Progress Notes (Signed)
Pre visit review using our clinic review tool, if applicable. No additional management support is needed unless otherwise documented below in the visit note. 

## 2018-12-10 NOTE — Patient Instructions (Signed)
GO TO THE LAB : Get the blood work     GO TO THE FRONT DESK Schedule your next appointment  For a physical in 1 year       Check the  blood pressure 2  times a month   BP GOAL is between 110/65 and  135/85. If it is consistently higher or lower, let me know

## 2018-12-11 NOTE — Assessment & Plan Note (Addendum)
Here for CPX HTN: On amlodipine, no recent ambulatory BPs, BP was elevated upon arrival, I rechecked right arm: 115/60.  No change, recommend to check BPs at home once or twice a month. Bee sting allergies: Advised to check the expiration date on his EpiPen and call for refills if needed RTC 1 year

## 2018-12-11 NOTE — Assessment & Plan Note (Signed)
-  Td 11/2016.  Flu shot today -Diet and exercise: works for the [place] [employer], very active, counseled about diet   -Laboratory: CMP, FLP, CBC, A1c

## 2019-04-08 ENCOUNTER — Other Ambulatory Visit: Payer: Self-pay | Admitting: Internal Medicine

## 2019-12-15 ENCOUNTER — Ambulatory Visit (INDEPENDENT_AMBULATORY_CARE_PROVIDER_SITE_OTHER): Payer: Federal, State, Local not specified - PPO | Admitting: Internal Medicine

## 2019-12-15 ENCOUNTER — Other Ambulatory Visit: Payer: Self-pay

## 2019-12-15 ENCOUNTER — Encounter: Payer: Self-pay | Admitting: Internal Medicine

## 2019-12-15 VITALS — BP 130/90 | HR 88 | Temp 98.1°F | Ht 70.0 in | Wt 195.2 lb

## 2019-12-15 DIAGNOSIS — Z23 Encounter for immunization: Secondary | ICD-10-CM

## 2019-12-15 DIAGNOSIS — Z0000 Encounter for general adult medical examination without abnormal findings: Secondary | ICD-10-CM | POA: Diagnosis not present

## 2019-12-15 DIAGNOSIS — Z1159 Encounter for screening for other viral diseases: Secondary | ICD-10-CM

## 2019-12-15 LAB — CBC WITH DIFFERENTIAL/PLATELET
Basophils Absolute: 0.1 10*3/uL (ref 0.0–0.1)
Basophils Relative: 1 % (ref 0.0–3.0)
Eosinophils Absolute: 0.6 10*3/uL (ref 0.0–0.7)
Eosinophils Relative: 9.2 % — ABNORMAL HIGH (ref 0.0–5.0)
HCT: 49.4 % (ref 39.0–52.0)
Hemoglobin: 16.9 g/dL (ref 13.0–17.0)
Lymphocytes Relative: 27.8 % (ref 12.0–46.0)
Lymphs Abs: 1.7 10*3/uL (ref 0.7–4.0)
MCHC: 34.3 g/dL (ref 30.0–36.0)
MCV: 85.2 fl (ref 78.0–100.0)
Monocytes Absolute: 0.5 10*3/uL (ref 0.1–1.0)
Monocytes Relative: 8.9 % (ref 3.0–12.0)
Neutro Abs: 3.2 10*3/uL (ref 1.4–7.7)
Neutrophils Relative %: 53.1 % (ref 43.0–77.0)
Platelets: 300 10*3/uL (ref 150.0–400.0)
RBC: 5.8 Mil/uL (ref 4.22–5.81)
RDW: 13 % (ref 11.5–15.5)
WBC: 6 10*3/uL (ref 4.0–10.5)

## 2019-12-15 LAB — COMPREHENSIVE METABOLIC PANEL
ALT: 43 U/L (ref 0–53)
AST: 24 U/L (ref 0–37)
Albumin: 4.6 g/dL (ref 3.5–5.2)
Alkaline Phosphatase: 50 U/L (ref 39–117)
BUN: 12 mg/dL (ref 6–23)
CO2: 29 mEq/L (ref 19–32)
Calcium: 9.8 mg/dL (ref 8.4–10.5)
Chloride: 104 mEq/L (ref 96–112)
Creatinine, Ser: 0.87 mg/dL (ref 0.40–1.50)
GFR: 109.4 mL/min (ref >60.00)
Glucose, Bld: 98 mg/dL (ref 70–99)
Potassium: 4.4 mEq/L (ref 3.5–5.1)
Sodium: 138 mEq/L (ref 135–145)
Total Bilirubin: 1.2 mg/dL (ref 0.2–1.2)
Total Protein: 7 g/dL (ref 6.0–8.3)

## 2019-12-15 LAB — TSH: TSH: 0.45 u[IU]/mL (ref 0.35–4.50)

## 2019-12-15 LAB — LIPID PANEL
Cholesterol: 151 mg/dL (ref 0–200)
HDL: 37.5 mg/dL — ABNORMAL LOW (ref >39.00)
LDL Cholesterol: 99 mg/dL (ref 0–99)
NonHDL: 113
Total CHOL/HDL Ratio: 4
Triglycerides: 70 mg/dL (ref 0.0–149.0)
VLDL: 14 mg/dL (ref 0.0–40.0)

## 2019-12-15 NOTE — Patient Instructions (Signed)
Check the  blood pressure 2   times a month  BP GOAL is between 110/65 and  135/85. If it is consistently higher or lower, let me know   Watch your salt intake  GO TO THE LAB : Get the blood work     Christopher Lester, [place] back for a physical exam in 1 year.   DASH Eating Plan DASH stands for "Dietary Approaches to Stop Hypertension." The DASH eating plan is a healthy eating plan that has been shown to reduce high blood pressure (hypertension). It may also reduce your risk for type 2 diabetes, heart disease, and stroke. The DASH eating plan may also help with weight loss. What are tips for following this plan?  General guidelines  Avoid eating more than 2,300 mg (milligrams) of salt (sodium) a day. If you have hypertension, you may need to reduce your sodium intake to 1,500 mg a day.  Limit alcohol intake to no more than 1 drink a day for nonpregnant women and 2 drinks a day for men. One drink equals 12 oz of beer, 5 oz of wine, or 1 oz of hard liquor.  Work with your health care provider to maintain a healthy body weight or to lose weight. Ask what an ideal weight is for you.  Get at least 30 minutes of exercise that causes your heart to beat faster (aerobic exercise) most days of the week. Activities may include walking, swimming, or biking.  Work with your health care provider or diet and nutrition specialist (dietitian) to adjust your eating plan to your individual calorie needs. Reading food labels   Check food labels for the amount of sodium per serving. Choose foods with less than 5 percent of the Daily Value of sodium. Generally, foods with less than 300 mg of sodium per serving fit into this eating plan.  To find whole grains, look for the word "whole" as the first word in the ingredient list. Shopping  Buy products labeled as "low-sodium" or "no salt added."  Buy fresh foods. Avoid canned foods and premade or frozen  meals. Cooking  Avoid adding salt when cooking. Use salt-free seasonings or herbs instead of table salt or sea salt. Check with your health care provider or pharmacist before using salt substitutes.  Do not fry foods. Cook foods using healthy methods such as baking, boiling, grilling, and broiling instead.  Cook with heart-healthy oils, such as olive, canola, soybean, or sunflower oil. Meal planning  Eat a balanced diet that includes: ? 5 or more servings of fruits and vegetables each day. At each meal, try to fill half of your plate with fruits and vegetables. ? Up to 6-8 servings of whole grains each day. ? Less than 6 oz of lean meat, poultry, or fish each day. A 3-oz serving of meat is about the same size as a deck of cards. One egg equals 1 oz. ? 2 servings of low-fat dairy each day. ? A serving of nuts, seeds, or beans 5 times each week. ? Heart-healthy fats. Healthy fats called Omega-3 fatty acids are found in foods such as flaxseeds and coldwater fish, like sardines, salmon, and mackerel.  Limit how much you eat of the following: ? Canned or prepackaged foods. ? Food that is high in trans fat, such as fried foods. ? Food that is high in saturated fat, such as fatty meat. ? Sweets, desserts, sugary drinks, and other foods with added sugar. ? Full-fat  dairy products.  Do not salt foods before eating.  Try to eat at least 2 vegetarian meals each week.  Eat more home-cooked food and less restaurant, buffet, and fast food.  When eating at a restaurant, ask that your food be prepared with less salt or no salt, if possible. What foods are recommended? The items listed may not be a complete list. Talk with your dietitian about what dietary choices are best for you. Grains Whole-grain or whole-wheat bread. Whole-grain or whole-wheat pasta. Karis rice. [place]row. Bulgur. Whole-grain and low-sodium cereals. Pita bread. Low-fat, low-sodium crackers. Whole-wheat flour  tortillas. Vegetables Fresh or frozen vegetables (raw, steamed, roasted, or grilled). Low-sodium or reduced-sodium tomato and vegetable juice. Low-sodium or reduced-sodium tomato sauce and tomato paste. Low-sodium or reduced-sodium canned vegetables. Fruits All fresh, dried, or frozen fruit. Canned fruit in natural juice (without added sugar). Meat and other protein foods Skinless chicken or [place]. Ground chicken or [place]. Pork with fat trimmed off. Fish and seafood. Egg whites. Dried beans, peas, or lentils. Unsalted nuts, nut butters, and seeds. Unsalted canned beans. Lean cuts of beef with fat trimmed off. Low-sodium, lean deli meat. Dairy Low-fat (1%) or fat-free (skim) milk. Fat-free, low-fat, or reduced-fat cheeses. Nonfat, low-sodium ricotta or cottage cheese. Low-fat or nonfat yogurt. Low-fat, low-sodium cheese. Fats and oils Soft margarine without trans fats. Vegetable oil. Low-fat, reduced-fat, or light mayonnaise and salad dressings (reduced-sodium). Canola, safflower, olive, soybean, and sunflower oils. Avocado. Seasoning and other foods Herbs. Spices. Seasoning mixes without salt. Unsalted popcorn and pretzels. Fat-free sweets. What foods are not recommended? The items listed may not be a complete list. Talk with your dietitian about what dietary choices are best for you. Grains Baked goods made with fat, such as croissants, muffins, or some breads. Dry pasta or rice meal packs. Vegetables Creamed or fried vegetables. Vegetables in a cheese sauce. Regular canned vegetables (not low-sodium or reduced-sodium). Regular canned tomato sauce and paste (not low-sodium or reduced-sodium). Regular tomato and vegetable juice (not low-sodium or reduced-sodium). Angie Fava. Olives. Fruits Canned fruit in a light or heavy syrup. Fried fruit. Fruit in cream or butter sauce. Meat and other protein foods Fatty cuts of meat. Ribs. Fried meat. Berniece Salines. Sausage. Bologna and other processed lunch meats.  Salami. Fatback. Hotdogs. Bratwurst. Salted nuts and seeds. Canned beans with added salt. Canned or smoked fish. Whole eggs or egg yolks. Chicken or [place] with skin. Dairy Whole or 2% milk, cream, and half-and-half. Whole or full-fat cream cheese. Whole-fat or sweetened yogurt. Full-fat cheese. Nondairy creamers. Whipped toppings. Processed cheese and cheese spreads. Fats and oils Butter. Stick margarine. Lard. Shortening. Ghee. Bacon fat. Tropical oils, such as coconut, palm kernel, or palm oil. Seasoning and other foods Salted popcorn and pretzels. Onion salt, garlic salt, seasoned salt, table salt, and sea salt. Worcestershire sauce. Tartar sauce. Barbecue sauce. Teriyaki sauce. Soy sauce, including reduced-sodium. Steak sauce. Canned and packaged gravies. Fish sauce. Oyster sauce. Cocktail sauce. Horseradish that you find on the shelf. Ketchup. Mustard. Meat flavorings and tenderizers. Bouillon cubes. Hot sauce and Tabasco sauce. Premade or packaged marinades. Premade or packaged taco seasonings. Relishes. Regular salad dressings. Where to find more information:  National Heart, Lung, and [place]: https://wilson-eaton.com/  American Heart Association: www.heart.org Summary  The DASH eating plan is a healthy eating plan that has been shown to reduce high blood pressure (hypertension). It may also reduce your risk for type 2 diabetes, heart disease, and stroke.  With the DASH eating plan, you should  limit salt (sodium) intake to 2,300 mg a day. If you have hypertension, you may need to reduce your sodium intake to 1,500 mg a day.  When on the DASH eating plan, aim to eat more fresh fruits and vegetables, whole grains, lean proteins, low-fat dairy, and heart-healthy fats.  Work with your health care provider or diet and nutrition specialist (dietitian) to adjust your eating plan to your individual calorie needs. This information is not intended to replace advice given to you by your health  care provider. Make sure you discuss any questions you have with your health care provider. Document Revised: 12/06/2016 Document Reviewed: 12/18/2015 Elsevier Patient Education  2020 [employer].

## 2019-12-15 NOTE — Progress Notes (Signed)
Subjective:    Patient ID: Christopher Lester, male    DOB: 1980-05-20, 39 y.o.   MRN: [id number]  DOS:  12/15/2019 Type of visit - description: cpx Since the last office visit is doing okay. No major concerns.   BP Readings from Last 3 Encounters:  12/15/19 130/90  12/10/18 115/60  11/27/17 128/88     Review of Systems    A 14 point review of systems is negative    Past Medical History:  Diagnosis Date  . 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 Anxiety   . Asthma    as a child  . 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 HTN (hypertension)    started meds 2010  . Varicocele 04/24/2010   Varicocele diagnosed with a scrotal ultrasound, left greater than right     Past Surgical History:  Procedure Laterality Date  . HERNIA REPAIR Left 02-2012  . WISDOM TOOTH EXTRACTION  age 37     Allergies as of 12/15/2019      Reactions   Bee Venom       Medication List       Accurate as of December 15, 2019 11:59 PM. If you have any questions, ask your nurse or doctor.        amLODipine 10 MG tablet Commonly known as: NORVASC Take 1 tablet (10 mg total) by mouth daily.   cetirizine 10 MG tablet Commonly known as: ZYRTEC Take 10 mg by mouth daily.   EPINEPHrine 0.3 mg/0.3 mL Soaj injection Commonly known as: EPI-PEN Inject 1 mL (1 mg total) once as needed for up to 1 dose into the muscle.          Objective:   Physical Exam BP 130/90 (BP Location: Right Arm, Patient Position: Sitting, Cuff Size: Large)   Pulse 88   Temp 98.1 F (36.7 C) (Oral)   Ht 5\' 10"  (1.778 m)   Wt 195 lb 3.2 oz (88.5 kg)   SpO2 98%   BMI 28.01 kg/m  General: Well developed, NAD, BMI noted Neck: No  thyromegaly  HEENT:  Normocephalic . Face symmetric, atraumatic Lungs:  CTA B Normal respiratory effort, no intercostal retractions, no accessory muscle use. Heart: RRR,  no murmur.  Abdomen:  Not distended, soft, non-tender. No rebound or rigidity.   Lower extremities: no  pretibial edema bilaterally  Skin: Exposed areas without rash. Not pale. Not jaundice Neurologic:  alert & oriented X3.  Speech normal, gait appropriate for age and unassisted Strength symmetric and appropriate for age.  Psych: Cognition and judgment appear intact.  Cooperative with normal attention span and concentration.  Behavior appropriate. No anxious or depressed appearing.     Assessment      Assessment  HTN started medications 2010 Severe reaction to bee sting Psoriasis, guttate dx  2012 Varicocele dx 2012 by [place] L>R h/o anxiety H/o asthma  PLAN  Here for CPX HTN: On amlodipine, good compliance, no ambulatory BPs.  BP initially up, recheck: 130/90.  Recommend to check BP every 2 weeks and let me know if not at goal.  Risk of uncontrolled BP discussed. Reaction to bee stings: Encouraged to have a fresh EpiPen with him Social: Works at the [place] [employer], extremity busy but doing well emotionally. RTC 1 year as long as BP is well controlled   This visit occurred during the SARS-CoV-2 public health emergency.  Safety protocols were in place, including screening questions prior to the visit,  additional usage of staff PPE, and extensive cleaning of exam room while observing appropriate contact time as indicated for disinfecting solutions.

## 2019-12-16 ENCOUNTER — Encounter: Payer: Self-pay | Admitting: Internal Medicine

## 2019-12-16 LAB — HEPATITIS C ANTIBODY
Hepatitis C Ab: NONREACTIVE
SIGNAL TO CUT-OFF: 0.01 (ref <1.00)

## 2019-12-16 NOTE — Assessment & Plan Note (Signed)
-  Td 11/2016 - covid vax: Reluctant, pro>>cons d/w pt, declined  - Flu shot today -Diet and exercise: Very active at work, diet discussed.   -Laboratory:CMP, FLP, CBC, TSH, hep C

## 2019-12-16 NOTE — Assessment & Plan Note (Signed)
Here for CPX HTN: On amlodipine, good compliance, no ambulatory BPs.  BP initially up, recheck: 130/90.  Recommend to check BP every 2 weeks and let me know if not at goal.  Risk of uncontrolled BP discussed. Reaction to bee stings: Encouraged to have a fresh EpiPen with him Social: Works at the [place] [employer], extremity busy but doing well emotionally. RTC 1 year as long as BP is well controlled

## 2020-04-03 ENCOUNTER — Ambulatory Visit: Payer: Federal, State, Local not specified - PPO | Admitting: Internal Medicine

## 2020-04-03 ENCOUNTER — Encounter: Payer: Self-pay | Admitting: Internal Medicine

## 2020-04-03 ENCOUNTER — Other Ambulatory Visit: Payer: Self-pay

## 2020-04-03 VITALS — BP 142/98 | HR 67 | Temp 98.2°F | Resp 16 | Ht 70.0 in | Wt 194.4 lb

## 2020-04-03 DIAGNOSIS — K4090 Unilateral inguinal hernia, without obstruction or gangrene, not specified as recurrent: Secondary | ICD-10-CM

## 2020-04-03 NOTE — Assessment & Plan Note (Signed)
Right inguinal hernia: Incarceration symptoms discussed, recommend to see surgery.  Referral sent HTN: BP today slightly elevated, no recent ambulatory BPs, forgot to take amlodipine this morning. Plan: Good med compliance, start checking BPs at home.  Call me if not at goal.

## 2020-04-03 NOTE — Progress Notes (Signed)
   Subjective:    Patient ID: Christopher Lester, male    DOB: 11/27/1980, 40 y.o.   MRN: [id number]  DOS:  04/03/2020 Type of visit - description: acute  Few months history of discomfort/pressure at the right suprapubic area with lifting or Valsalva. Sometimes he had felt a lump that he was able to push in.  He denies any scrotal pain or swelling No nausea or vomiting  BP noted to be elevated today  BP Readings from Last 3 Encounters:  04/03/20 (!) 142/98  12/15/19 130/90  12/10/18 115/60     Review of Systems See above   Past Medical History:  Diagnosis Date  . 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 Anxiety   . Asthma    as a child  . 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 HTN (hypertension)    started meds 2010  . Varicocele 04/24/2010   Varicocele diagnosed with a scrotal ultrasound, left greater than right     Past Surgical History:  Procedure Laterality Date  . HERNIA REPAIR Left 02-2012  . WISDOM TOOTH EXTRACTION  age 14     Allergies as of 04/03/2020      Reactions   Bee Venom       Medication List       Accurate as of April 03, 2020 10:28 AM. If you have any questions, ask your nurse or doctor.        amLODipine 10 MG tablet Commonly known as: NORVASC Take 1 tablet (10 mg total) by mouth daily.   cetirizine 10 MG tablet Commonly known as: ZYRTEC Take 10 mg by mouth daily.   EPINEPHrine 0.3 mg/0.3 mL Soaj injection Commonly known as: EPI-PEN Inject 1 mL (1 mg total) once as needed for up to 1 dose into the muscle.          Objective:   Physical Exam BP (!) 142/98 (BP Location: Left Arm, Patient Position: Sitting, Cuff Size: Normal)   Pulse 67   Temp 98.2 F (36.8 C) (Oral)   Resp 16   Ht 5\' 10"  (1.778 m)   Wt 194 lb 6 oz (88.2 kg)   SpO2 97%   BMI 27.89 kg/m  General:   Well developed, NAD, BMI noted. HEENT:  Normocephalic . Face symmetric, atraumatic Abdomen: Not distended, soft, nontender Inguinal areas: L: wnl R: +  Suprapubic hernia, reducible. GU: Scrotal contents normal. Lower extremities: no pretibial edema bilaterally  Skin: Not pale. Not jaundice Neurologic:  alert & oriented X3.  Speech normal, gait appropriate for age and unassisted Psych--  Cognition and judgment appear intact.  Cooperative with normal attention span and concentration.  Behavior appropriate. No anxious or depressed appearing.      Assessment     Assessment  HTN started medications 2010 Severe reaction to bee sting Psoriasis, guttate dx  2012 Varicocele dx 2012 by [place] L>R h/o anxiety H/o asthma FH hemochromatosis   PLAN  Right inguinal hernia: Incarceration symptoms discussed, recommend to see surgery.  Referral sent HTN: BP today slightly elevated, no recent ambulatory BPs, forgot to take amlodipine this morning. Plan: Good med compliance, start checking BPs at home.  Call me if not at goal.   This visit occurred during the SARS-CoV-2 public health emergency.  Safety protocols were in place, including screening questions prior to the visit, additional usage of staff PPE, and extensive cleaning of exam room while observing appropriate contact time as indicated for disinfecting solutions.

## 2020-04-03 NOTE — Patient Instructions (Signed)
Check the  blood pressure 2 or 3 times a month   BP GOAL is between 110/65 and  135/85. If it is consistently higher or lower, let me know      Inguinal Hernia, Adult An inguinal hernia is when fat or your intestines push through a weak spot in a muscle where your leg meets your lower belly (groin). This causes a bulge. This kind of hernia could also be:  In your scrotum, if you are male.  In folds of skin around your vagina, if you are male. There are three types of inguinal hernias:  Hernias that can be pushed back into the belly (are reducible). This type rarely causes pain.  Hernias that cannot be pushed back into the belly (are incarcerated).  Hernias that cannot be pushed back into the belly and lose their blood supply (are strangulated). This type needs emergency surgery. What are the causes? This condition is caused by having a weak spot in the muscles or tissues in your groin. This develops over time. The hernia may poke through the weak spot when you strain your lower belly muscles all of a sudden, such as when you:  Lift a heavy object.  Strain to poop (have a bowel movement). Trouble pooping (constipation) can lead to straining.  Cough. What increases the risk? This condition is more likely to develop in:  Males.  Pregnant females.  People who: ? Are overweight. ? Work in jobs that require long periods of standing or heavy lifting. ? Have had an inguinal hernia before. ? Smoke or have lung disease. These factors can lead to long-term (chronic) coughing. What are the signs or symptoms? Symptoms may depend on the size of the hernia. Often, a small hernia has no symptoms. Symptoms of a larger hernia may include:  A bulge in the groin area. This is easier to see when standing. You might not be able to see it when you are lying down.  Pain or burning in the groin. This may get worse when you lift, strain, or cough.  A dull ache or a feeling of pressure in the  groin.  An abnormal bulge in the scrotum, in males. Symptoms of a strangulated inguinal hernia may include:  A bulge in your groin that is very painful and tender to the touch.  A bulge that turns red or purple.  Fever, feeling like you may vomit (nausea), and vomiting.  Not being able to poop or to pass gas. How is this treated? Treatment depends on the size of your hernia and whether you have symptoms. If you do not have symptoms, your doctor may have you watch your hernia carefully and have you come in for follow-up visits. If your hernia is large or if you have symptoms, you may need surgery to repair the hernia. Follow these instructions at home: Lifestyle  Avoid lifting heavy objects.  Avoid standing for long amounts of time.  Do not smoke or use any products that contain nicotine or tobacco. If you need help quitting, ask your doctor.  Stay at a healthy weight. Prevent trouble pooping You may need to take these actions to prevent or treat trouble pooping:  Drink enough fluid to keep your pee (urine) pale yellow.  Take over-the-counter or prescription medicines.  Eat foods that are high in fiber. These include beans, whole grains, and fresh fruits and vegetables.  Limit foods that are high in fat and sugar. These include fried or sweet foods. General instructions  You may try to push your hernia back in place by very gently pressing on it when you are lying down. Do not try to push the bulge back in if it will not go in easily.  Watch your hernia for any changes in shape, size, or color. Tell your doctor if you see any changes.  Take over-the-counter and prescription medicines only as told by your doctor.  Keep all follow-up visits. Contact a doctor if:  You have a fever or chills.  You have new symptoms.  Your symptoms get worse. Get help right away if:  You have pain in your groin that gets worse all of a sudden.  You have a bulge in your groin  that: ? Gets bigger all of a sudden, and it does not get smaller after that. ? Turns red or purple. ? Is painful when you touch it.  You are a male, and you have: ? Sudden pain in your scrotum. ? A sudden change in the size of your scrotum.  You cannot push the hernia back in place by very gently pressing on it when you are lying down.  You feel like you may vomit, and that feeling does not go away.  You keep vomiting.  You have a fast heartbeat.  You cannot poop or pass gas. These symptoms may be an emergency. Get help right away. Call your local emergency services (911 in the [place]  Do not wait to see if the symptoms will go away.  Do not drive yourself to the hospital. Summary  An inguinal hernia is when fat or your intestines push through a weak spot in a muscle where your leg meets your lower belly (groin). This causes a bulge.  If you do not have symptoms, you may not need treatment. If you have symptoms or a large hernia, you may need surgery.  Avoid lifting heavy objects. Also, avoid standing for long amounts of time.  Do not try to push the bulge back in if it will not go in easily. This information is not intended to replace advice given to you by your health care provider. Make sure you discuss any questions you have with your health care provider. Document Revised: 08/24/2019 Document Reviewed: 08/24/2019 Elsevier Patient Education  2021 [employer].

## 2020-04-25 ENCOUNTER — Other Ambulatory Visit: Payer: Self-pay | Admitting: Internal Medicine

## 2020-05-05 ENCOUNTER — Ambulatory Visit: Payer: Self-pay | Admitting: General Surgery

## 2020-05-05 DIAGNOSIS — K4090 Unilateral inguinal hernia, without obstruction or gangrene, not specified as recurrent: Secondary | ICD-10-CM | POA: Diagnosis not present

## 2020-05-05 NOTE — H&P (Signed)
Christopher Lester Appointment: 05/05/2020 9:00 AM Location: [facility] Patient #: [id number] DOB: 03/07/1980 Married / Language: English / Race: Refused to Report/Unreported Male  History of Present Illness Christopher Hiss M. Christopher Belson MD; 05/05/2020 12:05 PM) The patient is a 40 year old male who presents with an inguinal hernia. He comes in with complaints of a right inguinal hernia. I repaired a left indirect inguinal hernia on him laparoscopic back in 2014. He states that he has noticed some discomfort in his right groin probably since November. At first it really didn't bother him but now it's bother him on a more frequent basis. He states that he can feel it more. He describes it as an ache. No burning, shooting or stabbing pain. No left groin pain. Some nocturia. No trouble starting stream or postvoid residual. No tobacco. 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 No additional abdominal surgery. No tobacco use. No chest pain or chest pressure or shortness of breath   Problem List/Past Medical Christopher Hiss M. [name], MD; 05/05/2020 12:06 PM) RIGHT INGUINAL HERNIA (K40.90)  Past Surgical History [name], RN; 05/05/2020 9:08 AM) Laparoscopic Inguinal Hernia Surgery Left. Oral Surgery  Diagnostic Studies History [name], RN; 05/05/2020 9:08 AM) Colonoscopy never  Allergies [name], RN; 05/05/2020 9:14 AM) Ana-Kit Bee Sting *VASOPRESSORS* Anaphylaxis. Allergies Reconciled  Medication History ([name], RN; 05/05/2020 9:15 AM) amLODIPine Besylate (10MG Tablet, Oral) Active. ZyrTEC Allergy (10MG Tablet, Oral) Active. Epi E-Z Pen (1:1000 Device, Injection) Active. Medications Reconciled  Social History [name], RN; 05/05/2020 9:08 AM) Alcohol use Occasional alcohol use. Caffeine use Carbonated beverages, Coffee, Tea. No drug use Tobacco use Never smoker.  Family History [name], RN; 05/05/2020 9:08  AM) Hypertension Father, Mother. Thyroid problems Family Members In General.  Other Problems Christopher Hiss M. [name], MD; 05/05/2020 12:06 PM) Asthma High blood pressure Inguinal Hernia     Review of Systems Christopher Hiss M. [name] MD; 05/05/2020 12:05 PM) General Not Present- Appetite Loss, Chills, Fatigue, Fever, Night Sweats, Weight Gain and Weight Loss. Skin Not Present- Change in Wart/Mole, Dryness, Hives, Jaundice, New Lesions, Non-Healing Wounds, Rash and Ulcer. HEENT Present- Seasonal Allergies. Not Present- Earache, Hearing Loss, Hoarseness, Nose Bleed, Oral Ulcers, Ringing in the Ears, Sinus Pain, Sore Throat, Visual Disturbances, Wears glasses/contact lenses and Yellow Eyes. Respiratory Not Present- Bloody sputum, Chronic Cough, Difficulty Breathing, Snoring and Wheezing. Breast Not Present- Breast Mass, Breast Pain, Nipple Discharge and Skin Changes. Cardiovascular Not Present- Chest Pain, Difficulty Breathing Lying Down, Leg Cramps, Palpitations, Rapid Heart Rate, Shortness of Breath and Swelling of Extremities. Gastrointestinal Not Present- Abdominal Pain, Bloating, Bloody Stool, Change in Bowel Habits, Chronic diarrhea, Constipation, Difficulty Swallowing, Excessive gas, Gets full quickly at meals, Hemorrhoids, Indigestion, Nausea, Rectal Pain and Vomiting. Male Genitourinary Not Present- Blood in Urine, Change in Urinary Stream, Frequency, Impotence, Nocturia, Painful Urination, Urgency and Urine Leakage. Musculoskeletal Not Present- Back Pain, Joint Pain, Joint Stiffness, Muscle Pain, Muscle Weakness and Swelling of Extremities. Neurological Not Present- Decreased Memory, Fainting, Headaches, Numbness, Seizures, Tingling, Tremor, Trouble walking and Weakness. Psychiatric Not Present- Anxiety, Bipolar, Change in Sleep Pattern, Depression, Fearful and Frequent crying. Endocrine Not Present- Cold Intolerance, Excessive Hunger, Hair Changes, Heat Intolerance, Hot flashes and New  Diabetes. Hematology Not Present- Blood Thinners, Easy Bruising, Excessive bleeding, Gland problems, HIV and Persistent Infections. All other systems negative  Vitals ([name] RN; 05/05/2020 9:13 AM) 05/05/2020 9:12 AM Weight: 193.5 lb Height: 70in Body Surface Area: 2.06 m  Body Mass Index: 27.76 kg/m  Temp.: 98.44F  Pulse: 88 (Regular)  P.OX: 98% (Room air) BP: 128/84(Sitting, Left Arm, Standard)        Physical Exam Christopher Hiss M. Vercie Pokorny MD; 05/05/2020 12:05 PM)  General Mental Status-Alert. General Appearance-Consistent with stated age. Hydration-Well hydrated. Voice-Normal.  Head and Neck Head-normocephalic, atraumatic with no lesions or palpable masses. Trachea-midline. Thyroid Gland Characteristics - normal size and consistency.  Eye Eyeball - Bilateral-Normal. Sclera/Conjunctiva - Bilateral-No scleral icterus.  Chest and Lung Exam Chest and lung exam reveals -quiet, even and easy respiratory effort with no use of accessory muscles and on auscultation, normal breath sounds, no adventitious sounds and normal vocal resonance. Inspection Chest Wall - Normal. Back - normal.  Breast - Did not examine.  Cardiovascular Cardiovascular examination reveals -normal heart sounds, regular rate and rhythm with no murmurs and normal pedal pulses bilaterally.  Abdomen Inspection Inspection of the abdomen reveals - No Hernias. Skin - Scar - Note: well healed trocar scars. Palpation/Percussion Palpation and Percussion of the abdomen reveal - Soft, Non Tender, No Rebound tenderness, No Rigidity (guarding) and No hepatosplenomegaly. Auscultation Auscultation of the abdomen reveals - Bowel sounds normal.  Male Genitourinary Note: No bulge with Valsalva in left groin. Obvious right groin bulge. Reducible. Both testicles descended.  Peripheral Vascular Upper Extremity Palpation - Pulses bilaterally normal.  Neurologic Neurologic evaluation  reveals -alert and oriented x 3 with no impairment of recent or remote memory. Mental Status-Normal.  Neuropsychiatric The patient's mood and affect are described as -normal. Judgment and Insight-insight is appropriate concerning matters relevant to self.  Musculoskeletal Normal Exam - Left-Upper Extremity Strength Normal and Lower Extremity Strength Normal. Normal Exam - Right-Upper Extremity Strength Normal and Lower Extremity Strength Normal.  Lymphatic Head & Neck  General Head & Neck Lymphatics: Bilateral - Description - Normal. Axillary - Did not examine. Femoral & Inguinal - Did not examine.    Assessment & Plan Christopher Hiss M. Taje Tondreau MD; 05/05/2020 12:06 PM)  RIGHT INGUINAL HERNIA (K40.90) Impression: We discussed the etiology of inguinal hernias. We discussed the signs & symptoms of incarceration & strangulation. We discussed non-operative and operative management. We discussed open versus laparoscopic versus robotic approach  The patient has elected robotic repair of right inguinal hernia with mesh I described the procedure in detail. The patient was given educational material. We discussed the risks and benefits including but not limited to bleeding, infection, chronic inguinal pain, nerve entrapment, hernia recurrence, mesh complications, hematoma formation, urinary retention, injury to the testicle, seroma formation, numbness in the groin, blood clots, injury to the surrounding structures, and anesthesia risk. We also discussed the typical post operative recovery course, including no heavy lifting for 4-6 weeks. I explained that the likelihood of improvement of their symptoms is good  This patient encounter took 30 minutes today to perform the following: take history, perform exam, review outside records, interpret imaging, counsel the patient on their diagnosis and document encounter, findings & plan in the EHR  Current Plans Pt Education - Pamphlet Given -  Laparoscopic Hernia Repair: discussed with patient and provided information. You are being scheduled for surgery- Our schedulers will call you.  You should hear from our office's scheduling department within 5 working days about the location, date, and time of surgery. We try to make accommodations for patient's preferences in scheduling surgery, but sometimes the OR schedule or the surgeon's schedule prevents [place] from making those accommodations.  If you have not heard from our office [phone]) in 5  working days, call the office and ask for your surgeon's nurse.  If you have other questions about your diagnosis, plan, or surgery, call the office and ask for your surgeon's nurse.  Leighton Ruff. [name], MD, FACS General, Bariatric, & Minimally Invasive Surgery [facility], [place]

## 2020-05-05 NOTE — H&P (View-Only) (Signed)
Christopher Lester Housekeeper Appointment: 05/05/2020 9:00 AM Location: [facility] Patient #: [id number] DOB: 02/17/1980 Married / Language: English / Race: Refused to Report/Unreported Male  History of Present Illness [name] M. [name] MD; 05/05/2020 12:05 PM) The patient is a 40 year old male who presents with an inguinal hernia. He comes in with complaints of a right inguinal hernia. I repaired a left indirect inguinal hernia on him laparoscopic back in 2014. He states that he has noticed some discomfort in his right groin probably since November. At first it really didn't bother him but now it's bother him on a more frequent basis. He states that he can feel it more. He describes it as an ache. No burning, shooting or stabbing pain. No left groin pain. Some nocturia. No trouble starting stream or postvoid residual. No tobacco. 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 No additional abdominal surgery. No tobacco use. No chest pain or chest pressure or shortness of breath   Problem List/Past Medical [name] M. [name], MD; 05/05/2020 12:06 PM) RIGHT INGUINAL HERNIA (K40.90)  Past Surgical History [name], RN; 05/05/2020 9:08 AM) Laparoscopic Inguinal Hernia Surgery Left. Oral Surgery  Diagnostic Studies History [name], RN; 05/05/2020 9:08 AM) Colonoscopy never  Allergies [name], RN; 05/05/2020 9:14 AM) Ana-Kit Bee Sting *VASOPRESSORS* Anaphylaxis. Allergies Reconciled  Medication History ([name], RN; 05/05/2020 9:15 AM) amLODIPine Besylate (10MG Tablet, Oral) Active. ZyrTEC Allergy (10MG Tablet, Oral) Active. Epi E-Z Pen (1:1000 Device, Injection) Active. Medications Reconciled  Social History [name], RN; 05/05/2020 9:08 AM) Alcohol use Occasional alcohol use. Caffeine use Carbonated beverages, Coffee, Tea. No drug use Tobacco use Never smoker.  Family History [name], RN; 05/05/2020 9:08  AM) Hypertension Father, Mother. Thyroid problems Family Members In General.  Other Problems [name] M. [name], MD; 05/05/2020 12:06 PM) Asthma High blood pressure Inguinal Hernia     Review of Systems [name] M. [name] MD; 05/05/2020 12:05 PM) General Not Present- Appetite Loss, Chills, Fatigue, Fever, Night Sweats, Weight Gain and Weight Loss. Skin Not Present- Change in Wart/Mole, Dryness, Hives, Jaundice, New Lesions, Non-Healing Wounds, Rash and Ulcer. HEENT Present- Seasonal Allergies. Not Present- Earache, Hearing Loss, Hoarseness, Nose Bleed, Oral Ulcers, Ringing in the Ears, Sinus Pain, Sore Throat, Visual Disturbances, Wears glasses/contact lenses and Yellow Eyes. Respiratory Not Present- Bloody sputum, Chronic Cough, Difficulty Breathing, Snoring and Wheezing. Breast Not Present- Breast Mass, Breast Pain, Nipple Discharge and Skin Changes. Cardiovascular Not Present- Chest Pain, Difficulty Breathing Lying Down, Leg Cramps, Palpitations, Rapid Heart Rate, Shortness of Breath and Swelling of Extremities. Gastrointestinal Not Present- Abdominal Pain, Bloating, Bloody Stool, Change in Bowel Habits, Chronic diarrhea, Constipation, Difficulty Swallowing, Excessive gas, Gets full quickly at meals, Hemorrhoids, Indigestion, Nausea, Rectal Pain and Vomiting. Male Genitourinary Not Present- Blood in Urine, Change in Urinary Stream, Frequency, Impotence, Nocturia, Painful Urination, Urgency and Urine Leakage. Musculoskeletal Not Present- Back Pain, Joint Pain, Joint Stiffness, Muscle Pain, Muscle Weakness and Swelling of Extremities. Neurological Not Present- Decreased Memory, Fainting, Headaches, Numbness, Seizures, Tingling, Tremor, Trouble walking and Weakness. Psychiatric Not Present- Anxiety, Bipolar, Change in Sleep Pattern, Depression, Fearful and Frequent crying. Endocrine Not Present- Cold Intolerance, Excessive Hunger, Hair Changes, Heat Intolerance, Hot flashes and New  Diabetes. Hematology Not Present- Blood Thinners, Easy Bruising, Excessive bleeding, Gland problems, HIV and Persistent Infections. All other systems negative  Vitals ([name] RN; 05/05/2020 9:13 AM) 05/05/2020 9:12 AM Weight: 193.5 lb Height: 70in Body Surface Area: 2.06 m  Body Mass Index: 27.76 kg/m  Temp.: 98.98F  Pulse: 88 (Regular)  P.OX: 98% (Room air) BP: 128/84(Sitting, Left Arm, Standard)        Physical Exam [name] M. [name] MD; 05/05/2020 12:05 PM)  General Mental Status-Alert. General Appearance-Consistent with stated age. Hydration-Well hydrated. Voice-Normal.  Head and Neck Head-normocephalic, atraumatic with no lesions or palpable masses. Trachea-midline. Thyroid Gland Characteristics - normal size and consistency.  Eye Eyeball - Bilateral-Normal. Sclera/Conjunctiva - Bilateral-No scleral icterus.  Chest and Lung Exam Chest and lung exam reveals -quiet, even and easy respiratory effort with no use of accessory muscles and on auscultation, normal breath sounds, no adventitious sounds and normal vocal resonance. Inspection Chest Wall - Normal. Back - normal.  Breast - Did not examine.  Cardiovascular Cardiovascular examination reveals -normal heart sounds, regular rate and rhythm with no murmurs and normal pedal pulses bilaterally.  Abdomen Inspection Inspection of the abdomen reveals - No Hernias. Skin - Scar - Note: well healed trocar scars. Palpation/Percussion Palpation and Percussion of the abdomen reveal - Soft, Non Tender, No Rebound tenderness, No Rigidity (guarding) and No hepatosplenomegaly. Auscultation Auscultation of the abdomen reveals - Bowel sounds normal.  Male Genitourinary Note: No bulge with Valsalva in left groin. Obvious right groin bulge. Reducible. Both testicles descended.  Peripheral Vascular Upper Extremity Palpation - Pulses bilaterally normal.  Neurologic Neurologic evaluation  reveals -alert and oriented x 3 with no impairment of recent or remote memory. Mental Status-Normal.  Neuropsychiatric The patient's mood and affect are described as -normal. Judgment and Insight-insight is appropriate concerning matters relevant to self.  Musculoskeletal Normal Exam - Left-Upper Extremity Strength Normal and Lower Extremity Strength Normal. Normal Exam - Right-Upper Extremity Strength Normal and Lower Extremity Strength Normal.  Lymphatic Head & Neck  General Head & Neck Lymphatics: Bilateral - Description - Normal. Axillary - Did not examine. Femoral & Inguinal - Did not examine.    Assessment & Plan [name] M. Miyo Aina MD; 05/05/2020 12:06 PM)  RIGHT INGUINAL HERNIA (K40.90) Impression: We discussed the etiology of inguinal hernias. We discussed the signs & symptoms of incarceration & strangulation. We discussed non-operative and operative management. We discussed open versus laparoscopic versus robotic approach  The patient has elected robotic repair of right inguinal hernia with mesh I described the procedure in detail. The patient was given educational material. We discussed the risks and benefits including but not limited to bleeding, infection, chronic inguinal pain, nerve entrapment, hernia recurrence, mesh complications, hematoma formation, urinary retention, injury to the testicle, seroma formation, numbness in the groin, blood clots, injury to the surrounding structures, and anesthesia risk. We also discussed the typical post operative recovery course, including no heavy lifting for 4-6 weeks. I explained that the likelihood of improvement of their symptoms is good  This patient encounter took 30 minutes today to perform the following: take history, perform exam, review outside records, interpret imaging, counsel the patient on their diagnosis and document encounter, findings & plan in the EHR  Current Plans Pt Education - Pamphlet Given -  Laparoscopic Hernia Repair: discussed with patient and provided information. You are being scheduled for surgery- Our schedulers will call you.  You should hear from our office's scheduling department within 5 working days about the location, date, and time of surgery. We try to make accommodations for patient's preferences in scheduling surgery, but sometimes the OR schedule or the surgeon's schedule prevents [place] from making those accommodations.  If you have not heard from our office [phone]) in 5  working days, call the office and ask for your surgeon's nurse.  If you have other questions about your diagnosis, plan, or surgery, call the office and ask for your surgeon's nurse.  Leighton Ruff. [name], MD, FACS General, Bariatric, & Minimally Invasive Surgery [facility], [place]

## 2020-05-15 NOTE — Patient Instructions (Addendum)
DUE TO COVID-19 ONLY ONE VISITOR IS ALLOWED TO COME WITH YOU AND STAY IN THE WAITING ROOM ONLY DURING PRE OP AND PROCEDURE DAY OF SURGERY. THE 1 VISITOR  MAY VISIT WITH YOU AFTER SURGERY IN YOUR PRIVATE ROOM DURING VISITING HOURS ONLY!  YOU NEED TO HAVE A COVID 19 TEST ON_5/10______ @_9 :50______, THIS TEST MUST BE DONE BEFORE SURGERY,  COVID TESTING SITE [place] [id number], IT IS ON THE RIGHT GOING OUT [address] APPROXIMATELY  2 MINUTES PAST [employer] ON THE RIGHT. ONCE YOUR COVID TEST IS COMPLETED,  PLEASE BEGIN THE QUARANTINE INSTRUCTIONS AS OUTLINED IN YOUR HANDOUT.                Christopher Lester    Your procedure is scheduled on: 05/19/20   Report to [facility] Main  Entrance   Report to admitting at   12:20 pm     Call this number if you have problems the morning of surgery [place], NO [place].   No food after midnight.    You may have clear liquid until 11:00 am    CLEAR LIQUID DIET   Foods Allowed                                                                     Foods Excluded  Coffee and tea, regular and decaf                             liquids that you cannot  Plain Jell-O any favor except red or purple                                           see through such as: Fruit ices (not with fruit pulp)                                     milk, soups, orange juice  Iced Popsicles                                    All solid food Carbonated beverages, regular and diet                                    Cranberry, grape and apple juices Sports drinks like Gatorade Lightly seasoned clear broth or consume(fat free) Sugar, honey syrup    At 10:30 AM drink pre surgery drink  . Nothing by mouth after 11:00 AM.    Take these medicines the morning of surgery with A SIP OF WATER: Amlodipine                                 You may not  have any metal on your body including              piercings  Do not wear jewelry,  lotions, powders or deodorant              Men may shave face and neck.   Do not bring valuables to the hospital. [place].  Contacts, dentures or bridgework may not be worn into surgery.      Patients discharged the day of surgery will not be allowed to drive home.   IF YOU ARE HAVING SURGERY AND GOING HOME THE SAME DAY, YOU MUST HAVE AN ADULT TO DRIVE YOU HOME AND BE WITH YOU FOR 24 HOURS.   YOU MAY GO HOME BY TAXI OR UBER OR ORTHERWISE, BUT AN ADULT MUST ACCOMPANY YOU HOME AND STAY WITH YOU FOR 24 HOURS.  Name and phone number of your driver:  Special Instructions: N/A              Please read over the following fact sheets you were given: _____________________________________________________________________             [facility] - Preparing for Surgery Before surgery, you can play an important role.  Because skin is not sterile, your skin needs to be as free of germs as possible.  You can reduce the number of germs on your skin by washing with CHG (chlorahexidine gluconate) soap before surgery.  CHG is an antiseptic cleaner which kills germs and bonds with the skin to continue killing germs even after washing. Please DO NOT use if you have an allergy to CHG or antibacterial soaps.  If your skin becomes reddened/irritated stop using the CHG and inform your nurse when you arrive at Short Stay.   You may shave your face/neck.  Please follow these instructions carefully:  1.  Shower with CHG Soap the night before surgery and the  morning of Surgery.  2.  If you choose to wash your hair, wash your hair first as usual with your  normal  shampoo.  3.  After you shampoo, rinse your hair and body thoroughly to remove the  shampoo.                                        4.  Use CHG as you would any other liquid soap.  You can apply chg directly  to the  skin and wash                       Gently with a scrungie or clean washcloth.  5.  Apply the CHG Soap to your body ONLY FROM THE NECK DOWN.   Do not use on face/ open                           Wound or open sores. Avoid contact with eyes, ears mouth and genitals (private parts).                       Wash face,  Genitals (private parts) with your normal soap.             6.  Wash thoroughly, paying special attention to the area where your surgery  will be performed.  7.  Thoroughly rinse your body with warm water from the neck down.  8.  DO NOT shower/wash with your normal soap after using and rinsing off  the CHG Soap.             9.  Pat yourself dry with a clean towel.            10.  Wear clean pajamas.            11.  Place clean sheets on your bed the night of your first shower and do not  sleep with pets. Day of Surgery : Do not apply any lotions/deodorants the morning of surgery.  Please wear clean clothes to the hospital/surgery center.  FAILURE TO FOLLOW THESE INSTRUCTIONS MAY RESULT IN THE CANCELLATION OF YOUR SURGERY PATIENT SIGNATURE_________________________________  NURSE SIGNATURE__________________________________  ________________________________________________________________________

## 2020-05-16 ENCOUNTER — Other Ambulatory Visit (HOSPITAL_COMMUNITY)
Admission: RE | Admit: 2020-05-16 | Discharge: 2020-05-16 | Disposition: A | Discharge status: Home / Self Care | Payer: Federal, State, Local not specified - PPO | Source: Ambulatory Visit | Attending: General Surgery | Admitting: General Surgery

## 2020-05-16 ENCOUNTER — Other Ambulatory Visit: Payer: Self-pay

## 2020-05-16 ENCOUNTER — Encounter (HOSPITAL_COMMUNITY): Payer: Self-pay

## 2020-05-16 ENCOUNTER — Other Ambulatory Visit (HOSPITAL_COMMUNITY): Payer: Federal, State, Local not specified - PPO

## 2020-05-16 ENCOUNTER — Encounter (HOSPITAL_COMMUNITY)
Admission: RE | Admit: 2020-05-16 | Discharge: 2020-05-16 | Disposition: A | Discharge status: Home / Self Care | Payer: Federal, State, Local not specified - PPO | Source: Ambulatory Visit | Attending: General Surgery | Admitting: General Surgery

## 2020-05-16 DIAGNOSIS — Z01812 Encounter for preprocedural laboratory examination: Secondary | ICD-10-CM | POA: Insufficient documentation

## 2020-05-16 DIAGNOSIS — D176 Benign lipomatous neoplasm of spermatic cord: Secondary | ICD-10-CM | POA: Diagnosis not present

## 2020-05-16 DIAGNOSIS — K4090 Unilateral inguinal hernia, without obstruction or gangrene, not specified as recurrent: Secondary | ICD-10-CM | POA: Diagnosis not present

## 2020-05-16 DIAGNOSIS — Z20822 Contact with and (suspected) exposure to covid-19: Secondary | ICD-10-CM | POA: Diagnosis not present

## 2020-05-16 DIAGNOSIS — Z01818 Encounter for other preprocedural examination: Secondary | ICD-10-CM | POA: Insufficient documentation

## 2020-05-16 DIAGNOSIS — Z79899 Other long term (current) drug therapy: Secondary | ICD-10-CM | POA: Diagnosis not present

## 2020-05-16 LAB — CBC
HCT: 49 % (ref 39.0–52.0)
Hemoglobin: 16.9 g/dL (ref 13.0–17.0)
MCH: 29.3 pg (ref 26.0–34.0)
MCHC: 34.5 g/dL (ref 30.0–36.0)
MCV: 85.1 fL (ref 80.0–100.0)
Platelets: 316 10*3/uL (ref 150–400)
RBC: 5.76 MIL/uL (ref 4.22–5.81)
RDW: 12.7 % (ref 11.5–15.5)
WBC: 6.9 10*3/uL (ref 4.0–10.5)
nRBC: 0 % (ref 0.0–0.2)

## 2020-05-16 LAB — BASIC METABOLIC PANEL
Anion gap: 6 (ref 5–15)
BUN: 13 mg/dL (ref 6–20)
CO2: 24 mmol/L (ref 22–32)
Calcium: 9.4 mg/dL (ref 8.9–10.3)
Chloride: 107 mmol/L (ref 98–111)
Creatinine, Ser: 0.81 mg/dL (ref 0.61–1.24)
GFR, Estimated: >60 mL/min (ref >60)
Glucose, Bld: 104 mg/dL — ABNORMAL HIGH (ref 70–99)
Potassium: 3.9 mmol/L (ref 3.5–5.1)
Sodium: 137 mmol/L (ref 135–145)

## 2020-05-16 LAB — SARS CORONAVIRUS 2 (TAT 6-24 HRS): SARS Coronavirus 2: NEGATIVE

## 2020-05-16 NOTE — Progress Notes (Signed)
COVID Vaccine Completed:No Date COVID Vaccine completed: COVID vaccine manufacturer: Pfizer    Moderna   [employer]   PCP - Dr. French Ana Cardiologist - none  Chest x-ray - no EKG - 05/16/20-chart, epic Stress Test - no ECHO - no Cardiac Cath - NA Pacemaker/ICD device last checked:NA  Sleep Study - NA CPAP -   Fasting Blood Sugar - NA Checks Blood Sugar _____ times a day  Blood Thinner Instructions:NA Aspirin Instructions: Last Dose:  Anesthesia review:   Patient denies shortness of breath, fever, cough and chest pain at PAT appointment Yes. Pt has no SOB with any activities  Patient verbalized understanding of instructions that were given to them at the PAT appointment. Patient was also instructed that they will need to review over the PAT instructions again at home before surgery.yes

## 2020-05-18 MED ORDER — BUPIVACAINE LIPOSOME 1.3 % IJ SUSP
20.0000 mL | INTRAMUSCULAR | Status: DC
  Filled 2020-05-18: qty 20

## 2020-05-18 NOTE — Progress Notes (Signed)
Spoke to patient and gave him new surgery time of 1200 PM, arrival time of 1000 and to become NPO at 0900.  Patient stated understanding.

## 2020-05-19 ENCOUNTER — Ambulatory Visit (HOSPITAL_COMMUNITY): Payer: Federal, State, Local not specified - PPO | Admitting: Registered Nurse

## 2020-05-19 ENCOUNTER — Encounter (HOSPITAL_COMMUNITY)
Admission: RE | Disposition: A | Discharge status: Home / Self Care | Payer: Self-pay | Source: Home / Self Care | Attending: General Surgery

## 2020-05-19 ENCOUNTER — Other Ambulatory Visit: Payer: Self-pay

## 2020-05-19 ENCOUNTER — Ambulatory Visit (HOSPITAL_COMMUNITY)
Admission: RE | Admit: 2020-05-19 | Discharge: 2020-05-19 | Disposition: A | Discharge status: Home / Self Care | Payer: Federal, State, Local not specified - PPO | Attending: General Surgery | Admitting: General Surgery

## 2020-05-19 ENCOUNTER — Encounter (HOSPITAL_COMMUNITY): Payer: Self-pay | Admitting: General Surgery

## 2020-05-19 DIAGNOSIS — K4090 Unilateral inguinal hernia, without obstruction or gangrene, not specified as recurrent: Secondary | ICD-10-CM | POA: Insufficient documentation

## 2020-05-19 DIAGNOSIS — D176 Benign lipomatous neoplasm of spermatic cord: Secondary | ICD-10-CM | POA: Insufficient documentation

## 2020-05-19 DIAGNOSIS — Z20822 Contact with and (suspected) exposure to covid-19: Secondary | ICD-10-CM | POA: Diagnosis not present

## 2020-05-19 DIAGNOSIS — Z79899 Other long term (current) drug therapy: Secondary | ICD-10-CM | POA: Insufficient documentation

## 2020-05-19 DIAGNOSIS — I10 Essential (primary) hypertension: Secondary | ICD-10-CM | POA: Diagnosis not present

## 2020-05-19 HISTORY — PX: XI ROBOTIC ASSISTED INGUINAL HERNIA REPAIR WITH MESH: SHX6706

## 2020-05-19 SURGERY — REPAIR, HERNIA, INGUINAL, ROBOT-ASSISTED, LAPAROSCOPIC, USING MESH
Anesthesia: General | Site: Abdomen | Laterality: Right

## 2020-05-19 MED ORDER — ORAL CARE MOUTH RINSE: 15.0000 mL | Freq: Once | OROMUCOSAL | Status: AC

## 2020-05-19 MED ORDER — BUPIVACAINE-EPINEPHRINE (PF) 0.25% -1:200000 IJ SOLN
INTRAMUSCULAR | Status: AC
  Filled 2020-05-19: qty 30

## 2020-05-19 MED ORDER — BUPIVACAINE LIPOSOME 1.3 % IJ SUSP
INTRAMUSCULAR | Status: DC | PRN
  Administered 2020-05-19: 20 mL

## 2020-05-19 MED ORDER — HYDROMORPHONE HCL 1 MG/ML IJ SOLN: 0.2500 mg | INTRAMUSCULAR | Status: DC | PRN

## 2020-05-19 MED ORDER — SCOPOLAMINE 1 MG/3DAYS TD PT72
MEDICATED_PATCH | TRANSDERMAL | Status: DC | PRN
  Administered 2020-05-19: 1 via TRANSDERMAL

## 2020-05-19 MED ORDER — LIDOCAINE 2% (20 MG/ML) 5 ML SYRINGE
INTRAMUSCULAR | Status: DC | PRN
  Administered 2020-05-19: 75 mg via INTRAVENOUS

## 2020-05-19 MED ORDER — PROPOFOL 10 MG/ML IV BOLUS
INTRAVENOUS | Status: AC
  Filled 2020-05-19: qty 20

## 2020-05-19 MED ORDER — AMISULPRIDE (ANTIEMETIC) 5 MG/2ML IV SOLN
INTRAVENOUS | Status: AC
  Filled 2020-05-19: qty 4

## 2020-05-19 MED ORDER — SUFENTANIL CITRATE 50 MCG/ML IV SOLN
INTRAVENOUS | Status: AC
  Filled 2020-05-19: qty 1

## 2020-05-19 MED ORDER — ENSURE PRE-SURGERY PO LIQD
296.0000 mL | Freq: Once | ORAL | Status: AC
  Administered 2020-05-19: 296 mL via ORAL
  Filled 2020-05-19: qty 296

## 2020-05-19 MED ORDER — ROCURONIUM BROMIDE 10 MG/ML (PF) SYRINGE
PREFILLED_SYRINGE | INTRAVENOUS | Status: DC | PRN
  Administered 2020-05-19: 20 mg via INTRAVENOUS
  Administered 2020-05-19: 80 mg via INTRAVENOUS

## 2020-05-19 MED ORDER — AMISULPRIDE (ANTIEMETIC) 5 MG/2ML IV SOLN
10.0000 mg | Freq: Once | INTRAVENOUS | Status: AC
  Administered 2020-05-19: 10 mg via INTRAVENOUS

## 2020-05-19 MED ORDER — CHLORHEXIDINE GLUCONATE 0.12 % MT SOLN
15.0000 mL | Freq: Once | OROMUCOSAL | Status: AC
  Administered 2020-05-19: 15 mL via OROMUCOSAL

## 2020-05-19 MED ORDER — CHLORHEXIDINE GLUCONATE CLOTH 2 % EX PADS: 6.0000 | MEDICATED_PAD | Freq: Once | CUTANEOUS | Status: DC

## 2020-05-19 MED ORDER — BUPIVACAINE-EPINEPHRINE (PF) 0.25% -1:200000 IJ SOLN
INTRAMUSCULAR | Status: DC | PRN
  Administered 2020-05-19: 30 mL

## 2020-05-19 MED ORDER — DEXAMETHASONE SODIUM PHOSPHATE 10 MG/ML IJ SOLN
INTRAMUSCULAR | Status: AC
  Filled 2020-05-19: qty 1

## 2020-05-19 MED ORDER — PROPOFOL 10 MG/ML IV BOLUS
INTRAVENOUS | Status: DC | PRN
  Administered 2020-05-19: 20 mg via INTRAVENOUS
  Administered 2020-05-19: 200 mg via INTRAVENOUS

## 2020-05-19 MED ORDER — OXYCODONE HCL 5 MG/5ML PO SOLN: 5.0000 mg | Freq: Once | ORAL | Status: DC | PRN

## 2020-05-19 MED ORDER — SUGAMMADEX SODIUM 200 MG/2ML IV SOLN
INTRAVENOUS | Status: DC | PRN
  Administered 2020-05-19: 200 mg via INTRAVENOUS

## 2020-05-19 MED ORDER — KETOROLAC TROMETHAMINE 30 MG/ML IJ SOLN
INTRAMUSCULAR | Status: AC
  Filled 2020-05-19: qty 1

## 2020-05-19 MED ORDER — ACETAMINOPHEN 500 MG PO TABS: 1000.0000 mg | ORAL_TABLET | Freq: Three times a day (TID) | ORAL | 0 refills | Status: AC

## 2020-05-19 MED ORDER — IBUPROFEN 600 MG PO TABS: 600.0000 mg | ORAL_TABLET | Freq: Three times a day (TID) | ORAL | 0 refills | Status: AC | PRN

## 2020-05-19 MED ORDER — MIDAZOLAM HCL 2 MG/2ML IJ SOLN
INTRAMUSCULAR | Status: AC
  Filled 2020-05-19: qty 2

## 2020-05-19 MED ORDER — CHLORHEXIDINE GLUCONATE CLOTH 2 % EX PADS
6.0000 | MEDICATED_PAD | Freq: Once | CUTANEOUS | Status: AC
  Administered 2020-05-19: 6 via TOPICAL

## 2020-05-19 MED ORDER — OXYCODONE HCL 5 MG PO TABS: 5.0000 mg | ORAL_TABLET | Freq: Once | ORAL | Status: DC | PRN

## 2020-05-19 MED ORDER — SUFENTANIL CITRATE 50 MCG/ML IV SOLN
INTRAVENOUS | Status: DC | PRN
  Administered 2020-05-19: 10 ug via INTRAVENOUS
  Administered 2020-05-19: 15 ug via INTRAVENOUS
  Administered 2020-05-19: 10 ug via INTRAVENOUS
  Administered 2020-05-19: 25 ug via INTRAVENOUS

## 2020-05-19 MED ORDER — KETAMINE HCL 10 MG/ML IJ SOLN
INTRAMUSCULAR | Status: DC | PRN
  Administered 2020-05-19: 10 mg via INTRAVENOUS
  Administered 2020-05-19: 50 mg via INTRAVENOUS

## 2020-05-19 MED ORDER — DEXAMETHASONE SODIUM PHOSPHATE 10 MG/ML IJ SOLN
INTRAMUSCULAR | Status: DC | PRN
  Administered 2020-05-19: 10 mg via INTRAVENOUS

## 2020-05-19 MED ORDER — MIDAZOLAM HCL 5 MG/5ML IJ SOLN
INTRAMUSCULAR | Status: DC | PRN
  Administered 2020-05-19 (×2): 2 mg via INTRAVENOUS

## 2020-05-19 MED ORDER — LABETALOL HCL 5 MG/ML IV SOLN
INTRAVENOUS | Status: DC | PRN
  Administered 2020-05-19 (×2): 5 mg via INTRAVENOUS

## 2020-05-19 MED ORDER — 0.9 % SODIUM CHLORIDE (POUR BTL) OPTIME
TOPICAL | Status: DC | PRN
  Administered 2020-05-19: 1000 mL

## 2020-05-19 MED ORDER — CEFAZOLIN SODIUM-DEXTROSE 2-4 GM/100ML-% IV SOLN
2.0000 g | INTRAVENOUS | Status: AC
  Administered 2020-05-19: 2 g via INTRAVENOUS
  Filled 2020-05-19: qty 100

## 2020-05-19 MED ORDER — ACETAMINOPHEN 500 MG PO TABS
1000.0000 mg | ORAL_TABLET | ORAL | Status: AC
  Administered 2020-05-19: 1000 mg via ORAL
  Filled 2020-05-19: qty 2

## 2020-05-19 MED ORDER — PROMETHAZINE HCL 25 MG/ML IJ SOLN
12.5000 mg | Freq: Once | INTRAMUSCULAR | Status: DC | PRN
Start: 2020-05-19 — End: 2020-05-19

## 2020-05-19 MED ORDER — SCOPOLAMINE 1 MG/3DAYS TD PT72
MEDICATED_PATCH | TRANSDERMAL | Status: AC
  Filled 2020-05-19: qty 1

## 2020-05-19 MED ORDER — LACTATED RINGERS IV SOLN: INTRAVENOUS | Status: DC

## 2020-05-19 MED ORDER — OXYCODONE HCL 5 MG PO TABS: 5.0000 mg | ORAL_TABLET | Freq: Four times a day (QID) | ORAL | 0 refills | Status: DC | PRN

## 2020-05-19 MED ORDER — KETOROLAC TROMETHAMINE 30 MG/ML IJ SOLN
INTRAMUSCULAR | Status: DC | PRN
  Administered 2020-05-19: 30 mg via INTRAVENOUS

## 2020-05-19 MED ORDER — OXYCODONE HCL 5 MG PO TABS
ORAL_TABLET | ORAL | Status: AC
  Filled 2020-05-19: qty 1

## 2020-05-19 MED ORDER — ONDANSETRON HCL 4 MG/2ML IJ SOLN
INTRAMUSCULAR | Status: AC
  Filled 2020-05-19: qty 2

## 2020-05-19 SURGICAL SUPPLY — 57 items
APL PRP STRL LF DISP 70% ISPRP (MISCELLANEOUS) ×1
BLADE SURG SZ11 CARB STEEL (BLADE) ×2 IMPLANT
CANNULA REDUC XI 12-8 STAPL (CANNULA)
CANNULA REDUCER 12-8 DVNC XI (CANNULA) IMPLANT
CHLORAPREP W/TINT 26 (MISCELLANEOUS) ×2 IMPLANT
CLSR STERI-STRIP ANTIMIC 1/2X4 (GAUZE/BANDAGES/DRESSINGS) ×2 IMPLANT
COVER SURGICAL LIGHT HANDLE (MISCELLANEOUS) ×2 IMPLANT
COVER TIP SHEARS 8 DVNC (MISCELLANEOUS) ×1 IMPLANT
COVER TIP SHEARS 8MM DA VINCI (MISCELLANEOUS) ×2
COVER WAND RF STERILE (DRAPES) IMPLANT
DECANTER SPIKE VIAL GLASS SM (MISCELLANEOUS) ×2 IMPLANT
DRAPE ARM DVNC X/XI (DISPOSABLE) ×3 IMPLANT
DRAPE COLUMN DVNC XI (DISPOSABLE) ×1 IMPLANT
DRAPE DA VINCI XI ARM (DISPOSABLE) ×6
DRAPE DA VINCI XI COLUMN (DISPOSABLE) ×2
DRSG TEGADERM 2-3/8X2-3/4 SM (GAUZE/BANDAGES/DRESSINGS) ×6 IMPLANT
ELECT REM PT RETURN 15FT ADLT (MISCELLANEOUS) ×2 IMPLANT
GAUZE SPONGE 2X2 8PLY STRL LF (GAUZE/BANDAGES/DRESSINGS) ×1 IMPLANT
GLOVE BIOGEL PI ORTHO PRO 7.5 (GLOVE) ×2
GLOVE PI ORTHO PRO STRL 7.5 (GLOVE) ×2 IMPLANT
GOWN STRL REUS W/TWL XL LVL3 (GOWN DISPOSABLE) ×4 IMPLANT
GRASPER SUT TROCAR 14GX15 (MISCELLANEOUS) IMPLANT
KIT BASIN OR (CUSTOM PROCEDURE TRAY) ×2 IMPLANT
KIT TURNOVER KIT A (KITS) ×2 IMPLANT
MARKER SKIN DUAL TIP RULER LAB (MISCELLANEOUS) ×2 IMPLANT
MESH 3DMAX 4X6 RT LRG (Mesh General) ×1 IMPLANT
NDL INSUFFLATION 14GA 120MM (NEEDLE) IMPLANT
NEEDLE HYPO 22GX1.5 SAFETY (NEEDLE) ×2 IMPLANT
NEEDLE INSUFFLATION 14GA 120MM (NEEDLE) IMPLANT
PACK CARDIOVASCULAR III (CUSTOM PROCEDURE TRAY) ×2 IMPLANT
PAD POSITIONING PINK XL (MISCELLANEOUS) ×2 IMPLANT
SCISSORS LAP 5X35 DISP (ENDOMECHANICALS) IMPLANT
SEAL CANN UNIV 5-8 DVNC XI (MISCELLANEOUS) ×3 IMPLANT
SEAL XI 5MM-8MM UNIVERSAL (MISCELLANEOUS) ×6
SET IRRIG TUBING LAPAROSCOPIC (IRRIGATION / IRRIGATOR) IMPLANT
SOL ANTI FOG 6CC (MISCELLANEOUS) ×1 IMPLANT
SOLUTION ANTI FOG 6CC (MISCELLANEOUS) ×1
SOLUTION ELECTROLUBE (MISCELLANEOUS) ×2 IMPLANT
SPONGE GAUZE 2X2 STER 10/PKG (GAUZE/BANDAGES/DRESSINGS) ×1
SPONGE LAP 18X18 RF (DISPOSABLE) ×2 IMPLANT
STAPLER CANNULA SEAL DVNC XI (STAPLE) IMPLANT
STAPLER CANNULA SEAL XI (STAPLE)
STRIP CLOSURE SKIN 1/2X4 (GAUZE/BANDAGES/DRESSINGS) ×2 IMPLANT
SUT MNCRL AB 4-0 PS2 18 (SUTURE) ×2 IMPLANT
SUT VIC AB 2-0 SH 27 (SUTURE) ×4
SUT VIC AB 2-0 SH 27X BRD (SUTURE) ×2 IMPLANT
SUT VICRYL 0 UR6 27IN ABS (SUTURE) ×2 IMPLANT
SUT VLOC 180 2-0 6IN GS21 (SUTURE) IMPLANT
SUT VLOC 180 2-0 9IN GS21 (SUTURE) IMPLANT
SUT VLOC 180 3-0 9IN GS21 (SUTURE) ×2 IMPLANT
SYR 10ML ECCENTRIC (SYRINGE) IMPLANT
SYR 20ML LL LF (SYRINGE) ×2 IMPLANT
TOWEL OR 17X26 10 PK STRL BLUE (TOWEL DISPOSABLE) ×2 IMPLANT
TOWEL OR NON WOVEN STRL DISP B (DISPOSABLE) ×2 IMPLANT
TROCAR BLADELESS OPT 5 100 (ENDOMECHANICALS) IMPLANT
TROCAR XCEL BLUNT TIP 100MML (ENDOMECHANICALS) ×2 IMPLANT
TUBING INSUFFLATION 10FT LAP (TUBING) ×2 IMPLANT

## 2020-05-19 NOTE — Anesthesia Preprocedure Evaluation (Signed)
Anesthesia Evaluation  Patient identified by MRN, date of birth, ID band Patient awake    Reviewed: Allergy & Precautions, NPO status , Patient's Chart, lab work & pertinent test results, reviewed documented beta blocker date and time   Airway Mallampati: I  TM Distance: >3 FB Neck ROM: Full    Dental no notable dental hx. (+) Teeth Intact, Dental Advisory Given   Pulmonary asthma ,    Pulmonary exam normal breath sounds clear to auscultation       Cardiovascular hypertension, Pt. on medications Normal cardiovascular exam Rhythm:Regular Rate:Normal     Neuro/Psych negative neurological ROS  negative psych ROS   GI/Hepatic negative GI ROS, Neg liver ROS,   Endo/Other  negative endocrine ROS  Renal/GU negative Renal ROS  negative genitourinary   Musculoskeletal Guttate psoriasis Right inguinal hernia    Abdominal   Peds  Hematology negative hematology ROS (+)   Anesthesia Other Findings   Reproductive/Obstetrics                             Anesthesia Physical Anesthesia Plan  ASA: II  Anesthesia Plan: General   Post-op Pain Management:    Induction: Intravenous  PONV Risk Score and Plan: 4 or greater and Treatment may vary due to age or medical condition, Midazolam, Ondansetron and Dexamethasone  Airway Management Planned: Oral ETT  Additional Equipment:   Intra-op Plan:   Post-operative Plan: Extubation in OR  Informed Consent: I have reviewed the patients History and Physical, chart, labs and discussed the procedure including the risks, benefits and alternatives for the proposed anesthesia with the patient or authorized representative who has indicated his/her understanding and acceptance.     Dental advisory given  Plan Discussed with: CRNA and Anesthesiologist  Anesthesia Plan Comments:         Anesthesia Quick Evaluation

## 2020-05-19 NOTE — Transfer of Care (Signed)
Immediate Anesthesia Transfer of Care Note  Patient: Christopher Lester  Procedure(s) Performed: XI ROBOTIC ASSISTED RIGHT INGUINAL HERNIA REPAIR WITH MESH (Right Abdomen)  Patient Location: PACU  Anesthesia Type:General  Level of Consciousness: awake, alert , oriented and patient cooperative  Airway & Oxygen Therapy: Patient Spontanous Breathing and Patient connected to face mask oxygen  Post-op Assessment: Report given to RN, Post -op Vital signs reviewed and stable and Patient moving all extremities X 4  Post vital signs: stable  Last Vitals:  Vitals Value Taken Time  BP    Temp    Pulse    Resp    SpO2      Last Pain:  Vitals:   05/19/20 1013  TempSrc: Oral  PainSc: 0-No pain         Complications: No complications documented.

## 2020-05-19 NOTE — Op Note (Signed)
PREOPERATIVE DIAGNOSIS:  Right inguinal hernia.    POSTOPERATIVE DIAGNOSIS:  Right indirect inguinal hernia   PROCEDURE: Robotic/XI repair of right indirect inguinal hernia with  mesh (rTAPP).  Laparoscopic bilateral tap block   SURGEON: Leighton Ruff. [name], MD    ASSISTANT SURGEON: None.    ANESTHESIA: General plus local consisting of 0.25% Marcaine with epi mix with Exparel.    ESTIMATED BLOOD LOSS: Minimal.    FINDINGS: The patient had a  right indirect hernia.  It was repaired using Bard 3dmax  large mesh .  Patient's contralateral groin had been previously repaired laparoscopically.  The mesh was visible.  There is no evidence of a contralateral hernia.   SPECIMEN:  Cord lipoma which was discarded   INDICATIONS FOR PROCEDURE:  40yo presented for repair of a symptomatic inguinal hernia. The risks and benefits including but not limited to bleeding, infection, chronic inguinal pain, nerve entrapment, hernia recurrence, mesh complications, hematoma formation, urinary retention, injury to the testicles or the ovaries, numbness in the groin, blood clots, injury to the surrounding structures, and anesthesia risk was discussed with the patient.  He had previously undergone a laparoscopic repair of the left inguinal hernia back in 2014.   DESCRIPTION OF PROCEDURE: After obtaining verbal consent the patient was then taken back to the operating room, placed  supine on the operating room table. General endotracheal anesthesia was  established.  The right groin had been marked in preop with the patient verifying the operative site.  The patient had emptied their bladder prior to going back to  the operating room. Sequential compression devices were placed. The  abdomen and groin were prepped and draped in the usual standard surgical  fashion with ChloraPrep. The patient received oral Tylenol/gabapentin preoperatively as well as IV  antibiotics prior to the incision. A surgical time-out was performed.   Local was infiltrated at the base of the umbilicus.    Next, a 1-cm vertical supraumbilical incision was made with a #11 blade. The fascia  was grasped and lifted anteriorly. Next, the fascia was incised, and  the abdominal cavity was entered. Pursestring suture was placed around  the fascial edges using a 0 Vicryl. A 12-mm Hasson trocar was placed.  Pneumoperitoneum was smoothly established up to a patient pressure of 15  mmHg. Robotic Laparoscope was advanced.  There was no evidence of injury to surrounding structures.  Patient had a right indirect inguinal hernia.   laparoscopic tap block was performed for postoperative pain relief     Two additional 70mm robotic trochars were placed one on patient's left and one on patient's right in the midclavicular line about 2 cm above the supraumbilical trocar.  A large piece of right Bard 3D max mesh were placed through the [facility] trocar into the abdominal cavity.  I went ahead and placed a two 3-0 Vicryl suture on SH into the abdomen off to the side.  I then placed one 3-0 absorbable V-Loc sutures through the [facility] trocar into the abdomen off to the side.  I then placed a 8 mm trocar through the [facility] trocar.   We then deployed the robot for pelvic surgery from patient's left.  The robot was docked.  Robotic laparoscope was placed through the supraumbilical trocar and the anatomy was targeted.  The other arms were then connected to the trochars.  A pair of MCS scissors was placed through the right trocar and a fenestrated bipolar through the left trocar all under direct visualization.  I then  scrubbed out and went to the robotic console.    I then made incision along the peritoneum on the right, starting 2 inches above the anterior superior iliac spine and caring it medial toward the median umbilical ligament in a lazy S configuration using MCS scissors with electrocautery. The peritoneal flap was then gently dissected downward from the anterior abdominal  wall taking care not to  injure the inferior epigastric vessels. The pubic bone was identified. Coppers ligament identified and dissection crossed the midline also mobilized posterior/inferior to pubic bone.  The testicular vessels were identified.  Using traction and counter traction, I reduced the sac in  its entirety. The testicular vessels had been identified and preserved. The vas deferens was identified and preserved, and the hernia sac was stripped from those to  surrounding structures.    There was a cord lipoma which was isolated from the cord structures. I then went about creating a large pocket by lifting the peritoneum of the pelvic floor. I took great care not to injure the iliac vessels.      I then obtained the previously placed piece of Bard large 3D right max mesh for the right groin and placed it into the inguinal area.   half of it covered medial  to the inferior epigastric vessels and half of it lateral to the  inferior epigastric vessels. The defect was well  covered with the mesh.  I then secured the mesh to Cooper's ligament with two  interrupted 3-0 Vicryl suture.  I placed an additional suture superior medially along the edge of the mesh medial to the inferior gastric vessel.  I placed a 3 Vicryl suture laterally along the superior lateral edge of the mesh lateral to the inferior epigastric vessels.  I then closed the peritoneal flap with a running 3-0 Vicryl V-Loc.  The mesh was well covered.  There is no defect in the peritoneal flap.  The mesh was flat.  It had not curled up.     The surgical robot was undocked and moved away from the OR table.  I scrubbed back in The surgical tech & I  then placed a laparoscopic needle driver and removed the 3 sutures that had been placed in the abdomen at the begin the case along with the cord lipoma.   .   I removed the Hasson trocar and tied down the previously placed pursestring suture.  The closure was viewed laparoscopically.  I did  place an additional interrupted 0 Vicryl using a PMI suture passer laparoscopically.  There was no evidence of fascial defect. There was no air leak at the supraumbilical position. There was no  evidence of injury to surrounding structures. Pneumoperitoneum was released, and the remaining trocars were removed. All skin incisions  were closed with a 4-0 Monocryl in a subcuticular fashion followed by application of Steri-Strips and sterile bandages. All needle, instrument, and sponge counts  were correct x2.   There are no immediate complications. The patient tolerated the procedure well. The patient was extubated and taken to the  recovery room in stable condition.  Leighton Ruff. [name], MD, FACS General, Bariatric, & Minimally Invasive Surgery [facility], [place]

## 2020-05-19 NOTE — Anesthesia Postprocedure Evaluation (Signed)
Anesthesia Post Note  Patient: Christopher Lester  Procedure(s) Performed: XI ROBOTIC ASSISTED RIGHT INGUINAL HERNIA REPAIR WITH MESH (Right Abdomen)     Patient location during evaluation: PACU Anesthesia Type: General Level of consciousness: awake and alert, oriented and patient cooperative Pain management: pain level controlled Vital Signs Assessment: post-procedure vital signs reviewed and stable Respiratory status: spontaneous breathing, nonlabored ventilation and respiratory function stable Cardiovascular status: blood pressure returned to baseline and stable Postop Assessment: no apparent nausea or vomiting Anesthetic complications: no Comments: Hypertensive at baseline   No complications documented.  Last Vitals:  Vitals:   05/19/20 1013  BP: (!) 162/103  Pulse: (!) 109  Resp: 18  Temp: 36.9 C  SpO2: 100%    Last Pain:  Vitals:   05/19/20 1013  TempSrc: Oral  PainSc: 0-No pain                 Pervis Hocking

## 2020-05-19 NOTE — Interval H&P Note (Signed)
History and Physical Interval Note:  05/19/2020 10:37 AM  Christopher Lester  has presented today for surgery, with the diagnosis of right inguinal hernia.  The various methods of treatment have been discussed with the patient and family. After consideration of risks, benefits and other options for treatment, the patient has consented to  Procedure(s): XI ROBOTIC ASSISTED RIGHT INGUINAL HERNIA REPAIR WITH MESH (Right) as a surgical intervention.  The patient's history has been reviewed, patient examined, no change in status, stable for surgery.  I have reviewed the patient's chart and labs.  Questions were answered to the patient's satisfaction.    Leighton Ruff. [name], MD, FACS General, Bariatric, & Minimally Invasive Surgery [facility] [place]  [place]

## 2020-05-19 NOTE — Anesthesia Procedure Notes (Signed)
Procedure Name: Intubation Date/Time: 05/19/2020 11:07 AM Performed by: Lissa Morales, CRNA Pre-anesthesia Checklist: Patient identified, Emergency Drugs available, Suction available and Patient being monitored Patient Re-evaluated:Patient Re-evaluated prior to induction Oxygen Delivery Method: Circle system utilized Preoxygenation: Pre-oxygenation with 100% oxygen Induction Type: IV induction Ventilation: Mask ventilation without difficulty Laryngoscope Size: Mac and 4 Grade View: Grade II Tube type: Oral Tube size: 7.5 mm Number of attempts: 1 Airway Equipment and Method: Stylet and Oral airway Placement Confirmation: ETT inserted through vocal cords under direct vision,  positive ETCO2 and breath sounds checked- equal and bilateral Secured at: 22 cm Tube secured with: Tape Dental Injury: Teeth and Oropharynx as per pre-operative assessment

## 2020-05-19 NOTE — Discharge Instructions (Signed)
[facility]: POST OP INSTRUCTIONS Always review your discharge instruction sheet given to you by the facility where your surgery was performed. IF YOU HAVE DISABILITY OR FAMILY LEAVE FORMS, YOU MUST BRING THEM TO THE OFFICE FOR PROCESSING.   DO NOT GIVE THEM TO YOUR DOCTOR.  PAIN CONTROL  1. First take acetaminophen (Tylenol) AND/or ibuprofen (Advil) to control your pain after surgery.  Follow directions on package.  Taking acetaminophen (Tylenol) and/or ibuprofen (Advil) regularly after surgery will help to control your pain and lower the amount of prescription pain medication you may need.  You should not take more than 3,000 mg (3 grams) of acetaminophen (Tylenol) in 24 hours.  You should not take ibuprofen (Advil), aleve, motrin, naprosyn or other NSAIDS if you have a history of stomach ulcers or chronic kidney disease.  2. A prescription for pain medication may be given to you upon discharge.  Take your pain medication as prescribed, if you still have uncontrolled pain after taking acetaminophen (Tylenol) or ibuprofen (Advil). 3. Use ice packs to help control pain. 4. If you need a refill on your pain medication, please contact your pharmacy.  They will contact our office to request authorization. Prescriptions will not be filled after 5pm or on week-ends.  HOME MEDICATIONS 5. Take your usually prescribed medications unless otherwise directed.  DIET 6. You should follow a light diet the first few days after arrival home.  Be sure to include lots of fluids daily. Avoid fatty, fried foods.   CONSTIPATION 7. It is common to experience some constipation after surgery and if you are taking pain medication.  Increasing fluid intake and taking a stool softener (such as Colace) will usually help or prevent this problem from occurring.  A mild laxative (Milk of Magnesia or Miralax) should be taken according to package instructions if there are no bowel movements  after 48 hours.  WOUND/INCISION CARE 8. Most patients will experience some swelling and bruising in the area of the incisions.  Ice packs will help.  Swelling and bruising can take several days to resolve.  9. Unless discharge instructions indicate otherwise, follow guidelines below  a. STERI-STRIPS - you may remove your outer bandages 48 hours after surgery, and you may shower at that time.  You have steri-strips (small skin tapes) in place directly over the incision.  These strips should be left on the skin for 7-10 days.   b. DERMABOND/SKIN GLUE - you may shower in 24 hours.  The glue will flake off over the next 2-3 weeks. 10. Any sutures or staples will be removed at the office during your follow-up visit.  ACTIVITIES 11. You may resume regular (light) daily activities beginning the next day--such as daily self-care, walking, climbing stairs--gradually increasing activities as tolerated.  You may have sexual intercourse when it is comfortable.  Refrain from any heavy lifting or straining until approved by your doctor. a. You may drive when you are no longer taking prescription pain medication, you can comfortably wear a seatbelt, and you can safely maneuver your car and apply brakes.  FOLLOW-UP 12. You should see your doctor in the office for a follow-up appointment approximately 2-3 weeks after your surgery.  You should have been given your post-op/follow-up appointment when your surgery was scheduled.  If you did not receive a post-op/follow-up appointment, make sure that you call for this appointment within a day or two after you arrive home to insure a convenient appointment time.  OTHER  INSTRUCTIONS 13. DO NOT LIFT/PUSH/PULL ANYTHING GREATER > 15 LBS FOR 1 MONTH  WHEN TO CALL YOUR DOCTOR: 1. Fever over 101.0 2. Inability to urinate 3. Continued bleeding from incision. 4. Increased pain, redness, or drainage from the incision. 5. Increasing abdominal pain  The clinic staff is  available to answer your questions during regular business hours.  Please don't hesitate to call and ask to speak to one of the nurses for clinical concerns.  If you have a medical emergency, go to the nearest emergency room or call 911.  A surgeon from [facility] is always on call at the hospital. [address], [place], [place], [place]  [address] ? [address], [place], [place]   [address] [phone] ? [phone] ? FAX [phone] Web site: [email or web address]  ........Marland Kitchen   Managing Your Pain After Surgery Without Opioids    Thank you for participating in our program to help patients manage their pain after surgery without opioids. This is part of our effort to provide you with the best care possible, without exposing you or your family to the risk that opioids pose.  What pain can I expect after surgery? You can expect to have some pain after surgery. This is normal. The pain is typically worse the day after surgery, and quickly begins to get better. Many studies have found that many patients are able to manage their pain after surgery with Over-the-Counter (OTC) medications such as Tylenol and Motrin. If you have a condition that does not allow you to take Tylenol or Motrin, notify your surgical team.  How will I manage my pain? The best strategy for controlling your pain after surgery is around the clock pain control with Tylenol (acetaminophen) and Motrin (ibuprofen or Advil). Alternating these medications with each other allows you to maximize your pain control. In addition to Tylenol and Motrin, you can use heating pads or ice packs on your incisions to help reduce your pain.  How will I alternate your regular strength over-the-counter pain medication? You will take a dose of pain medication every three hours. ; Start by taking 650 mg of Tylenol (2 pills of 325 mg) ; 3 hours later take 600 mg of Motrin (3 pills of 200 mg) ; 3 hours after taking  the Motrin take 650 mg of Tylenol ; 3 hours after that take 600 mg of Motrin.   - 1 -  See example - if your first dose of Tylenol is at 12:00 PM   12:00 PM Tylenol 650 mg (2 pills of 325 mg)  3:00 PM Motrin 600 mg (3 pills of 200 mg)  6:00 PM Tylenol 650 mg (2 pills of 325 mg)  9:00 PM Motrin 600 mg (3 pills of 200 mg)  Continue alternating every 3 hours   We recommend that you follow this schedule around-the-clock for at least 3 days after surgery, or until you feel that it is no longer needed. Use the table on the last page of this handout to keep track of the medications you are taking. Important: Do not take more than 3000mg  of Tylenol or 1800mg  of Motrin in a 24-hour period. Do not take ibuprofen/Motrin if you have a history of bleeding stomach ulcers, severe kidney disease, &/or actively taking a blood thinner  What if I still have pain? If you have pain that is not controlled with the over-the-counter pain medications (Tylenol and Motrin or Advil) you might have what we call "breakthrough" pain. You will  receive a prescription for a small amount of an opioid pain medication such as Oxycodone, Tramadol, or Tylenol with Codeine. Use these opioid pills in the first 24 hours after surgery if you have breakthrough pain. Do not take more than 1 pill every 4-6 hours.  If you still have uncontrolled pain after using all opioid pills, don't hesitate to call our staff using the number provided. We will help make sure you are managing your pain in the best way possible, and if necessary, we can provide a prescription for additional pain medication.   Day 1    Time  Name of Medication Number of pills taken  Amount of Acetaminophen  Pain Level   Comments  AM PM       AM PM       AM PM       AM PM       AM PM       AM PM       AM PM       AM PM       Total Daily amount of Acetaminophen Do not take more than  3,000 mg per day      Day 2    Time  Name of Medication Number of  pills taken  Amount of Acetaminophen  Pain Level   Comments  AM PM       AM PM       AM PM       AM PM       AM PM       AM PM       AM PM       AM PM       Total Daily amount of Acetaminophen Do not take more than  3,000 mg per day      Day 3    Time  Name of Medication Number of pills taken  Amount of Acetaminophen  Pain Level   Comments  AM PM       AM PM       AM PM       AM PM          AM PM       AM PM       AM PM       AM PM       Total Daily amount of Acetaminophen Do not take more than  3,000 mg per day      Day 4    Time  Name of Medication Number of pills taken  Amount of Acetaminophen  Pain Level   Comments  AM PM       AM PM       AM PM       AM PM       AM PM       AM PM       AM PM       AM PM       Total Daily amount of Acetaminophen Do not take more than  3,000 mg per day      Day 5    Time  Name of Medication Number of pills taken  Amount of Acetaminophen  Pain Level   Comments  AM PM       AM PM       AM PM       AM PM       AM PM  AM PM       AM PM       AM PM       Total Daily amount of Acetaminophen Do not take more than  3,000 mg per day       Day 6    Time  Name of Medication Number of pills taken  Amount of Acetaminophen  Pain Level  Comments  AM PM       AM PM       AM PM       AM PM       AM PM       AM PM       AM PM       AM PM       Total Daily amount of Acetaminophen Do not take more than  3,000 mg per day      Day 7    Time  Name of Medication Number of pills taken  Amount of Acetaminophen  Pain Level   Comments  AM PM       AM PM       AM PM       AM PM       AM PM       AM PM       AM PM       AM PM       Total Daily amount of Acetaminophen Do not take more than  3,000 mg per day        For additional information about how and where to safely dispose of unused opioid medications - [employer]  Disclaimer: This document contains  information and/or instructional materials adapted from [place] for the typical patient with your condition. It does not replace medical advice from your health care provider because your experience may differ from that of the typical patient. Talk to your health care provider if you have any questions about this document, your condition or your treatment plan. Adapted from [place]

## 2020-05-22 ENCOUNTER — Encounter (HOSPITAL_COMMUNITY): Payer: Self-pay | Admitting: General Surgery

## 2020-12-19 ENCOUNTER — Encounter: Payer: Self-pay | Admitting: Internal Medicine

## 2020-12-19 ENCOUNTER — Ambulatory Visit (INDEPENDENT_AMBULATORY_CARE_PROVIDER_SITE_OTHER): Payer: Federal, State, Local not specified - PPO | Admitting: Internal Medicine

## 2020-12-19 VITALS — BP 142/80 | HR 83 | Temp 98.0°F | Resp 16 | Ht 70.0 in | Wt 197.2 lb

## 2020-12-19 DIAGNOSIS — I10 Essential (primary) hypertension: Secondary | ICD-10-CM | POA: Diagnosis not present

## 2020-12-19 DIAGNOSIS — Z23 Encounter for immunization: Secondary | ICD-10-CM | POA: Diagnosis not present

## 2020-12-19 DIAGNOSIS — Z0000 Encounter for general adult medical examination without abnormal findings: Secondary | ICD-10-CM

## 2020-12-19 LAB — TSH: TSH: 0.44 u[IU]/mL (ref 0.35–5.50)

## 2020-12-19 LAB — LIPID PANEL
Cholesterol: 154 mg/dL (ref 0–200)
HDL: 38.8 mg/dL — ABNORMAL LOW (ref >39.00)
LDL Cholesterol: 101 mg/dL — ABNORMAL HIGH (ref 0–99)
NonHDL: 115.07
Total CHOL/HDL Ratio: 4
Triglycerides: 69 mg/dL (ref 0.0–149.0)
VLDL: 13.8 mg/dL (ref 0.0–40.0)

## 2020-12-19 LAB — COMPREHENSIVE METABOLIC PANEL
ALT: 24 U/L (ref 0–53)
AST: 16 U/L (ref 0–37)
Albumin: 4.6 g/dL (ref 3.5–5.2)
Alkaline Phosphatase: 52 U/L (ref 39–117)
BUN: 14 mg/dL (ref 6–23)
CO2: 27 mEq/L (ref 19–32)
Calcium: 9.9 mg/dL (ref 8.4–10.5)
Chloride: 103 mEq/L (ref 96–112)
Creatinine, Ser: 0.89 mg/dL (ref 0.40–1.50)
GFR: 107.88 mL/min (ref >60.00)
Glucose, Bld: 93 mg/dL (ref 70–99)
Potassium: 4.3 mEq/L (ref 3.5–5.1)
Sodium: 138 mEq/L (ref 135–145)
Total Bilirubin: 1.2 mg/dL (ref 0.2–1.2)
Total Protein: 7 g/dL (ref 6.0–8.3)

## 2020-12-19 LAB — HEMOGLOBIN A1C: Hgb A1c MFr Bld: 5.7 % (ref 4.6–6.5)

## 2020-12-19 MED ORDER — LOSARTAN POTASSIUM 50 MG PO TABS: 50.0000 mg | ORAL_TABLET | Freq: Every day | ORAL | 1 refills | Status: DC

## 2020-12-19 NOTE — Assessment & Plan Note (Signed)
Here for CPX HTN: On amlodipine, diastolic BP in the ambulatory setting in the 90s.  Check labs today, needs better control >>add losartan 50, BMP in 2 weeks.  Rec to monitor BPs, stay always well-hydrated.  Low-salt diet encouraged. RTC 2 weeks labs RTC checkup 6 months

## 2020-12-19 NOTE — Progress Notes (Signed)
Subjective:    Patient ID: Christopher Lester, male    DOB: 05-24-1980, 40 y.o.   MRN: [id number]  DOS:  12/19/2020 Type of visit - description: cpx  Here for CPX He started to check his BPs about a month ago because he had a single episode of dizziness (without associated chest pain, diplopia, headaches, shortness of breath.  Symptoms lasted 10 minutes). Ambulatory BPs are typically in the 299B w/ diastolic BP well in the 71I.   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, SEVERE   Asthma    as a child   Guttate psoriasis 04/24/2010   HTN (hypertension)    started meds 2010   Varicocele 04/24/2010   Varicocele diagnosed with a scrotal ultrasound, left greater than right     Past Surgical History:  Procedure Laterality Date   HERNIA REPAIR Left 02-2012   WISDOM TOOTH EXTRACTION  age 30    XI ROBOTIC [place] Right 05/19/2020   Procedure: XI ROBOTIC ASSISTED RIGHT INGUINAL HERNIA REPAIR WITH MESH;  Surgeon: Greer Pickerel, MD;  Location: [facility];  Service: General;  Laterality: Right;   Social History   Socioeconomic History   Marital status: Married    Spouse name: Not on file   Number of children: 2   Years of education: Not on file   Highest education level: Not on file  Occupational History   Occupation: works for the [place] [employer]    Employer: [employer]  Tobacco Use   Smoking status: Never   Smokeless tobacco: Never  Vaping Use   Vaping Use: Never used  Substance and Sexual Activity   Alcohol use: Yes    Comment: rarely   Drug use: No   Sexual activity: Not on file  Other Topics Concern   Not on file  Social History Narrative   Married, 2 children (boy 2011, girl 2012)     1 step son (2006)   works for the [employer] broadcast assistant Strain: Not on [employer] Insecurity:  Not on file  Transportation Needs: Not on file  Physical Activity: Not on file  Stress: Not on file  Social Connections: Not on file  Intimate Partner Violence: Not on file    Allergies as of 12/19/2020       Reactions   Bee Venom Shortness Of Breath, Swelling        Medication List        Accurate as of December 19, 2020  5:31 PM. If you have any questions, ask your nurse or doctor.          STOP taking these medications    oxyCODONE 5 MG immediate release tablet Commonly known as: Oxy IR/ROXICODONE Stopped by: Kathlene November, MD       TAKE these medications    amLODipine 10 MG tablet Commonly known as: NORVASC Take 1 tablet (10 mg total) by mouth daily.   cetirizine 10 MG tablet Commonly known as: ZYRTEC Take 10 mg by mouth daily.   EPINEPHrine 0.3 mg/0.3 mL Soaj injection Commonly known as: EPI-PEN Inject 1 mL (1 mg total) once as needed for up to 1 dose into the muscle.   losartan 50 MG tablet Commonly known as: COZAAR  Take 1 tablet (50 mg total) by mouth daily. Started by: Kathlene November, MD           Objective:   Physical Exam BP (!) 142/80 (BP Location: Left Arm, Patient Position: Sitting, Cuff Size: Small)   Pulse 83   Temp 98 F (36.7 C) (Oral)   Resp 16   Ht 5\' 10"  (1.778 m)   Wt 197 lb 4 oz (89.5 kg)   SpO2 98%   BMI 28.30 kg/m  General: Well developed, NAD, BMI noted Neck: No  thyromegaly  HEENT:  Normocephalic . Face symmetric, atraumatic Lungs:  CTA B Normal respiratory effort, no intercostal retractions, no accessory muscle use. Heart: RRR,  no murmur.  Abdomen:  Not distended, soft, non-tender. No rebound or rigidity.   Lower extremities: no pretibial edema bilaterally  Skin: Exposed areas without rash. Not pale. Not jaundice Neurologic:  alert & oriented X3.  Speech normal, gait appropriate for age and unassisted Strength symmetric and appropriate for age.  Psych: Cognition and judgment appear intact.  Cooperative with  normal attention span and concentration.  Behavior appropriate. No anxious or depressed appearing.     Assessment    Assessment  HTN started medications 2010 Severe reaction to bee sting Psoriasis, guttate dx  2012 Varicocele dx 2012 by [place] L>R h/o anxiety H/o asthma FH hemochromatosis   PLAN  Here for CPX HTN: On amlodipine, diastolic BP in the ambulatory setting in the 90s.  Check labs today, needs better control >>add losartan 50, BMP in 2 weeks.  Rec to monitor BPs, stay always well-hydrated.  Low-salt diet encouraged. RTC 2 weeks labs RTC checkup 6 months   This visit occurred during the SARS-CoV-2 public health emergency.  Safety protocols were in place, including screening questions prior to the visit, additional usage of staff PPE, and extensive cleaning of exam room while observing appropriate contact time as indicated for disinfecting solutions.

## 2020-12-19 NOTE — Patient Instructions (Signed)
Add losartan 50 mg 1 tablet daily. Stay well-hydrated  Check the  blood pressure regularly (2 or 3 times a week to begin with) BP GOAL is between 110/65 and  135/85. If it is consistently higher or lower, let me know    GO TO THE LAB : Get the blood work     Christopher Lester, Christopher Lester back for   blood work in 2 weeks  Come back for a checkup in 6 months

## 2020-12-19 NOTE — Assessment & Plan Note (Signed)
°-   Tdap 2018 - covid vax: Declined again today - Flu shot today -Diet and exercise: Very active at work, low-salt diet discussed.   -Laboratory: CMP FLP A1c TSH

## 2021-01-04 ENCOUNTER — Other Ambulatory Visit (INDEPENDENT_AMBULATORY_CARE_PROVIDER_SITE_OTHER): Payer: Federal, State, Local not specified - PPO

## 2021-01-04 DIAGNOSIS — I10 Essential (primary) hypertension: Secondary | ICD-10-CM

## 2021-01-04 LAB — BASIC METABOLIC PANEL
BUN: 14 mg/dL (ref 6–23)
CO2: 31 mEq/L (ref 19–32)
Calcium: 9.6 mg/dL (ref 8.4–10.5)
Chloride: 102 mEq/L (ref 96–112)
Creatinine, Ser: 0.96 mg/dL (ref 0.40–1.50)
GFR: 99.47 mL/min (ref >60.00)
Glucose, Bld: 98 mg/dL (ref 70–99)
Potassium: 4.4 mEq/L (ref 3.5–5.1)
Sodium: 138 mEq/L (ref 135–145)

## 2021-01-05 MED ORDER — LOSARTAN POTASSIUM 50 MG PO TABS: 50.0000 mg | ORAL_TABLET | Freq: Every day | ORAL | 3 refills | Status: DC

## 2021-01-05 NOTE — Addendum Note (Signed)
Addended by: Damita Dunnings D on: 01/05/2021 01:51 PM   Modules accepted: Orders

## 2021-04-18 ENCOUNTER — Encounter: Payer: Self-pay | Admitting: Internal Medicine

## 2021-04-19 MED ORDER — AMLODIPINE BESYLATE 10 MG PO TABS: 10.0000 mg | ORAL_TABLET | Freq: Every day | ORAL | 2 refills | Status: DC

## 2021-06-05 DIAGNOSIS — K080 Exfoliation of teeth due to systemic causes: Secondary | ICD-10-CM | POA: Diagnosis not present

## 2021-06-13 ENCOUNTER — Ambulatory Visit: Payer: Federal, State, Local not specified - PPO | Admitting: Internal Medicine

## 2021-06-13 ENCOUNTER — Encounter: Payer: Self-pay | Admitting: Internal Medicine

## 2021-06-13 VITALS — BP 138/82 | HR 90 | Temp 98.1°F | Resp 16 | Ht 70.0 in | Wt 191.2 lb

## 2021-06-13 DIAGNOSIS — I10 Essential (primary) hypertension: Secondary | ICD-10-CM | POA: Diagnosis not present

## 2021-06-13 NOTE — Patient Instructions (Addendum)
Take amlodipine at night Take losartan in the morning  Check the  blood pressure regularly BP GOAL is between 110/65 and  135/85. If it is consistently higher or lower, let me know    HOW TO TAKE YOUR BLOOD PRESSURE:   Rest 5 minutes before taking your blood pressure.   Don't smoke or drink caffeinated beverages for at least 30 minutes before.   Take your blood pressure before (not after) you eat.   Sit comfortably with your back supported and both feet on the floor (don't cross your legs).   Elevate your arm to heart level on a table or a desk.   Use the proper sized cuff. It should fit smoothly and snugly around your bare upper arm. There should be enough room to slip a fingertip under the cuff. The bottom edge of the cuff should be 1 inch above the crease of the elbow.   Ideally, take 3 measurements at one sitting and record the average.      GO TO THE FRONT DESK, PLEASE SCHEDULE YOUR APPOINTMENTS Come back for a physical by December  2023

## 2021-06-13 NOTE — Progress Notes (Signed)
   Subjective:    Patient ID: Christopher Lester, male    DOB: 1980-03-28, 41 y.o.   MRN: [id number]  DOS:  06/13/2021 Type of visit - description: f/u  Since the last visit he is feeling well. He remains active. Diet is okay but  room for improvement. Denies any lower extremity edema. Ambulatory BPs were okay, he stopped checking a couple months ago.  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, SEVERE   Asthma    as a child   Guttate psoriasis 04/24/2010   HTN (hypertension)    started meds 2010   Varicocele 04/24/2010   Varicocele diagnosed with a scrotal ultrasound, left greater than right     Past Surgical History:  Procedure Laterality Date   HERNIA REPAIR Left 02-2012   WISDOM TOOTH EXTRACTION  age 10    XI ROBOTIC [place] Right 05/19/2020   Procedure: XI ROBOTIC ASSISTED RIGHT INGUINAL HERNIA REPAIR WITH MESH;  Surgeon: Greer Pickerel, MD;  Location: [facility];  Service: General;  Laterality: Right;    Current Outpatient Medications  Medication Instructions   amLODipine (NORVASC) 10 mg, Oral, Daily   cetirizine (ZYRTEC) 10 mg, Oral, Daily,     EPINEPHrine (EPI-PEN) 1 mg, Intramuscular, Once PRN   losartan (COZAAR) 50 mg, Oral, Daily       Objective:   Physical Exam BP 138/82   Pulse 90   Temp 98.1 F (36.7 C) (Oral)   Resp 16   Ht '5\' 10"'$  (1.778 m)   Wt 191 lb 4 oz (86.8 kg)   SpO2 99%   BMI 27.44 kg/m  General:   Well developed, NAD, BMI noted. HEENT:  Normocephalic . Face symmetric, atraumatic Lungs:  CTA B Normal respiratory effort, no intercostal retractions, no accessory muscle use. Heart: RRR,  no murmur.  Lower extremities: no pretibial edema bilaterally  Skin: Not pale. Not jaundice Neurologic:  alert & oriented X3.  Speech normal, gait  appropriate for age and unassisted Psych--  Cognition and judgment appear intact.  Cooperative with normal attention span and concentration.  Behavior appropriate. No anxious or depressed appearing.      Assessment     Assessment  HTN started medications 2010 Severe reaction to bee sting Psoriasis, guttate dx  2012 Varicocele dx 2012 by [place] L>R h/o anxiety H/o asthma FH hemochromatosis   PLAN  HTN:  on amlodipine, losartan added few months ago, follow-up BMP okay.  Ambulatory BPs when he was checking were in the 130/80, today is 138/82. I would like to see his BPs in the 120s, we agree no change for now, try to work on low-salt diet, monitor BPs at home. Consider adjust medication on RTC depending on readings. RTC December 2023 CPX

## 2021-06-13 NOTE — Assessment & Plan Note (Signed)
HTN:  on amlodipine, losartan added few months ago, follow-up BMP okay.  Ambulatory BPs when he was checking were in the 130/80, today is 138/82. I would like to see his BPs in the 120s, we agree no change for now, try to work on low-salt diet, monitor BPs at home. Consider adjust medication on RTC depending on readings. RTC December 2023 CPX

## 2021-09-05 ENCOUNTER — Encounter: Payer: Self-pay | Admitting: Internal Medicine

## 2022-04-18 ENCOUNTER — Encounter: Payer: Self-pay | Admitting: Internal Medicine

## 2022-05-02 ENCOUNTER — Telehealth: Payer: Self-pay | Admitting: Internal Medicine

## 2022-05-02 MED ORDER — LOSARTAN POTASSIUM 50 MG PO TABS: 50.0000 mg | ORAL_TABLET | Freq: Every day | ORAL | 1 refills | Status: DC

## 2022-05-02 MED ORDER — AMLODIPINE BESYLATE 10 MG PO TABS: 10.0000 mg | ORAL_TABLET | Freq: Every day | ORAL | 1 refills | Status: DC

## 2022-05-02 NOTE — Telephone Encounter (Signed)
Prescription Request  05/02/2022  Is this a "Controlled Substance" medicine? No  LOV: Visit date not found  What is the name of the medication or equipment? amLODipine (NORVASC) 10 MG table   losartan (COZAAR) 50 MG tablet    Have you contacted your pharmacy to request a refill? Yes   Which pharmacy would you like this sent to?  [facility] [place], [place] - [address] [place] [address] Phone: [phone] Fax: [phone]    Patient notified that their request is being sent to the clinical staff for review and that they should receive a response within 2 business days.   Please advise at Mobile [phone] (mobile)

## 2022-05-02 NOTE — Addendum Note (Signed)
Addended byConrad [place] D on: 05/02/2022 11:10 AM   Modules accepted: Orders

## 2022-05-02 NOTE — Telephone Encounter (Signed)
Rxs sent

## 2022-06-26 ENCOUNTER — Ambulatory Visit (INDEPENDENT_AMBULATORY_CARE_PROVIDER_SITE_OTHER): Payer: Federal, State, Local not specified - PPO | Admitting: Internal Medicine

## 2022-06-26 ENCOUNTER — Encounter: Payer: Self-pay | Admitting: Internal Medicine

## 2022-06-26 VITALS — BP 126/84 | HR 85 | Temp 98.4°F | Resp 16 | Ht 70.0 in | Wt 202.2 lb

## 2022-06-26 DIAGNOSIS — Z0000 Encounter for general adult medical examination without abnormal findings: Secondary | ICD-10-CM

## 2022-06-26 DIAGNOSIS — I10 Essential (primary) hypertension: Secondary | ICD-10-CM | POA: Diagnosis not present

## 2022-06-26 LAB — COMPREHENSIVE METABOLIC PANEL
ALT: 30 U/L (ref 0–53)
AST: 21 U/L (ref 0–37)
Albumin: 4.7 g/dL (ref 3.5–5.2)
Alkaline Phosphatase: 49 U/L (ref 39–117)
BUN: 13 mg/dL (ref 6–23)
CO2: 26 mEq/L (ref 19–32)
Calcium: 9.6 mg/dL (ref 8.4–10.5)
Chloride: 103 mEq/L (ref 96–112)
Creatinine, Ser: 0.84 mg/dL (ref 0.40–1.50)
GFR: 108.61 mL/min (ref >60.00)
Glucose, Bld: 96 mg/dL (ref 70–99)
Potassium: 4.3 mEq/L (ref 3.5–5.1)
Sodium: 137 mEq/L (ref 135–145)
Total Bilirubin: 1 mg/dL (ref 0.2–1.2)
Total Protein: 7.4 g/dL (ref 6.0–8.3)

## 2022-06-26 LAB — CBC WITH DIFFERENTIAL/PLATELET
Basophils Absolute: 0 10*3/uL (ref 0.0–0.1)
Basophils Relative: 0.8 % (ref 0.0–3.0)
Eosinophils Absolute: 0.2 10*3/uL (ref 0.0–0.7)
Eosinophils Relative: 3.4 % (ref 0.0–5.0)
HCT: 48.6 % (ref 39.0–52.0)
Hemoglobin: 16.4 g/dL (ref 13.0–17.0)
Lymphocytes Relative: 30.8 % (ref 12.0–46.0)
Lymphs Abs: 1.7 10*3/uL (ref 0.7–4.0)
MCHC: 33.7 g/dL (ref 30.0–36.0)
MCV: 85.8 fl (ref 78.0–100.0)
Monocytes Absolute: 0.5 10*3/uL (ref 0.1–1.0)
Monocytes Relative: 8.2 % (ref 3.0–12.0)
Neutro Abs: 3.2 10*3/uL (ref 1.4–7.7)
Neutrophils Relative %: 56.8 % (ref 43.0–77.0)
Platelets: 314 10*3/uL (ref 150.0–400.0)
RBC: 5.66 Mil/uL (ref 4.22–5.81)
RDW: 13.1 % (ref 11.5–15.5)
WBC: 5.7 10*3/uL (ref 4.0–10.5)

## 2022-06-26 LAB — LIPID PANEL
Cholesterol: 158 mg/dL (ref 0–200)
HDL: 34.3 mg/dL — ABNORMAL LOW (ref >39.00)
LDL Cholesterol: 101 mg/dL — ABNORMAL HIGH (ref 0–99)
NonHDL: 123.91
Total CHOL/HDL Ratio: 5
Triglycerides: 117 mg/dL (ref 0.0–149.0)
VLDL: 23.4 mg/dL (ref 0.0–40.0)

## 2022-06-26 LAB — HEMOGLOBIN A1C: Hgb A1c MFr Bld: 5.7 % (ref 4.6–6.5)

## 2022-06-26 MED ORDER — AMLODIPINE BESYLATE 10 MG PO TABS: 10.0000 mg | ORAL_TABLET | Freq: Every day | ORAL | 3 refills | Status: DC

## 2022-06-26 MED ORDER — EPINEPHRINE 0.3 MG/0.3ML IJ SOAJ: 1.0000 mg | Freq: Once | INTRAMUSCULAR | 3 refills | Status: AC | PRN

## 2022-06-26 MED ORDER — LOSARTAN POTASSIUM 50 MG PO TABS: 50.0000 mg | ORAL_TABLET | Freq: Every day | ORAL | 3 refills | Status: DC

## 2022-06-26 NOTE — Assessment & Plan Note (Signed)
-   Tdap 2018 - covid vax recommended  - Flu shot q fall  -Diet and exercise: Trying to lose weight, encourage healthier diet, portion control.  He is very active  -Laboratory: CMP FLP CBC A1c

## 2022-06-26 NOTE — Patient Instructions (Addendum)
Always carry EpiPen with you.  Vaccines I recommend: Flu shot this fall  Check the  blood pressure regularly BP GOAL is between 110/65 and  135/85. If it is consistently higher or lower, let me know  GO TO THE LAB : Get the blood work     GO TO THE FRONT DESK, PLEASE SCHEDULE YOUR APPOINTMENTS Come back for   a physical exam in a year

## 2022-06-26 NOTE — Assessment & Plan Note (Addendum)
Here for CPX  HTN: Refill amlodipine, losartan, reports normal ambulatory BPs, recommend to continue checking.  Labs. Bee stings: History of allergic reaction, encouraged to carry EpiPen, prescription sent Psoriasis: Currently silent. RTC 1 year.

## 2022-06-26 NOTE — Progress Notes (Signed)
Subjective:    Patient ID: Christopher Lester, male    DOB: June 22, 1980, 42 y.o.   MRN: [id number]  DOS:  06/26/2022 Type of visit - description: cpx  Here for CPX.   doing well. 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   HTN (hypertension)    started meds 2010   Varicocele 04/24/2010   Varicocele diagnosed with a scrotal ultrasound, left greater than right     Past Surgical History:  Procedure Laterality Date   HERNIA REPAIR Left 02-2012   WISDOM TOOTH EXTRACTION  age 53    XI ROBOTIC ASSISTED INGUINAL HERNIA REPAIR WITH MESH Right 05/19/2020   Procedure: XI ROBOTIC ASSISTED RIGHT INGUINAL HERNIA REPAIR WITH MESH;  Surgeon: Gaynelle Adu, MD;  Location: [facility];  Service: General;  Laterality: Right;   Social History   Socioeconomic History   Marital status: Married    Spouse name: Not on file   Number of children: 2   Years of education: Not on file   Highest education level: Not on file  Occupational History   Occupation: works for the [place] [employer]    Employer: [employer]  Tobacco Use   Smoking status: Never   Smokeless tobacco: Never  Vaping Use   Vaping Use: Never used  Substance and Sexual Activity   Alcohol use: Yes    Comment: rarely   Drug use: No   Sexual activity: Not on file  Other Topics Concern   Not on file  Social History Narrative   Married, 2 children (boy 2011, girl 2012)     1 step son (2006)   works for the [employer]       Social Determinants of Corporate investment banker Strain: Not on [employer] Insecurity: Not on file  Transportation Needs: Not on file  Physical Activity: Not on file  Stress: Not on file  Social Connections: Not on file  Intimate Partner Violence: Not on file    Current Outpatient Medications  Medication Instructions   amLODipine  (NORVASC) 10 mg, Oral, Daily   cetirizine (ZYRTEC) 10 mg, Oral, Daily,     EPINEPHrine (EPI-PEN) 1 mg, Intramuscular, Once PRN   losartan (COZAAR) 50 mg, Oral, Daily       Objective:   Physical Exam BP 126/84   Pulse 85   Temp 98.4 F (36.9 C) (Oral)   Resp 16   Ht 5\' 10"  (1.778 m)   Wt 202 lb 4 oz (91.7 kg)   SpO2 97%   BMI 29.02 kg/m  General: Well developed, NAD, BMI noted Neck: No  thyromegaly  HEENT:  Normocephalic . Face symmetric, atraumatic Lungs:  CTA B Normal respiratory effort, no intercostal retractions, no accessory muscle use. Heart: RRR,  no murmur.  Abdomen:  Not distended, soft, non-tender. No rebound or rigidity.   Lower extremities: no pretibial edema bilaterally  Skin: Exposed areas without rash. Not pale. Not jaundice Neurologic:  alert & oriented X3.  Speech normal, gait appropriate for age and unassisted Strength symmetric and appropriate for age.  Psych: Cognition and judgment appear intact.  Cooperative with normal attention span and concentration.  Behavior appropriate. No anxious or depressed appearing.     Assessment  Assessment  HTN started medications 2010 Severe reaction to bee sting Psoriasis, guttate dx  2012 Varicocele dx 2012 by [place] L>R h/o anxiety H/o asthma FH hemochromatosis   PLAN  Here for CPX  - Tdap 2018 - covid vax recommended  - Flu shot q fall  -Diet and exercise: Trying to lose weight, encourage healthier diet, portion control.  He is very active  -Laboratory: CMP FLP CBC A1c HTN: Refill amlodipine, losartan, reports normal ambulatory BPs, recommend to continue checking.  Labs. Bee stings: History of allergic reaction, encouraged to carry EpiPen, prescription sent Psoriasis: Currently silent. RTC 1 year.

## 2023-02-06 ENCOUNTER — Encounter: Payer: Self-pay | Admitting: Internal Medicine

## 2023-02-06 MED ORDER — LOSARTAN POTASSIUM 50 MG PO TABS: 50.0000 mg | ORAL_TABLET | Freq: Every day | ORAL | 1 refills | Status: AC

## 2023-02-06 MED ORDER — AMLODIPINE BESYLATE 10 MG PO TABS: 10.0000 mg | ORAL_TABLET | Freq: Every day | ORAL | 1 refills | Status: AC

## 2023-02-16 DIAGNOSIS — L404 Guttate psoriasis: Secondary | ICD-10-CM | POA: Diagnosis not present

## 2023-02-17 ENCOUNTER — Ambulatory Visit: Payer: Federal, State, Local not specified - PPO | Admitting: Internal Medicine

## 2023-02-24 DIAGNOSIS — L408 Other psoriasis: Secondary | ICD-10-CM | POA: Diagnosis not present

## 2023-04-08 DIAGNOSIS — L408 Other psoriasis: Secondary | ICD-10-CM | POA: Diagnosis not present

## 2023-07-01 ENCOUNTER — Encounter: Payer: Self-pay | Admitting: Internal Medicine

## 2023-07-01 ENCOUNTER — Ambulatory Visit (INDEPENDENT_AMBULATORY_CARE_PROVIDER_SITE_OTHER): Payer: Federal, State, Local not specified - PPO | Admitting: Internal Medicine

## 2023-07-01 VITALS — BP 118/80 | HR 90 | Temp 98.1°F | Resp 16 | Ht 70.0 in | Wt 199.4 lb

## 2023-07-01 DIAGNOSIS — Z0000 Encounter for general adult medical examination without abnormal findings: Secondary | ICD-10-CM | POA: Diagnosis not present

## 2023-07-01 DIAGNOSIS — Z23 Encounter for immunization: Secondary | ICD-10-CM | POA: Diagnosis not present

## 2023-07-01 DIAGNOSIS — I10 Essential (primary) hypertension: Secondary | ICD-10-CM

## 2023-07-01 LAB — CBC WITH DIFFERENTIAL/PLATELET
Basophils Absolute: 0.1 10*3/uL (ref 0.0–0.1)
Basophils Relative: 0.9 % (ref 0.0–3.0)
Eosinophils Absolute: 0.2 10*3/uL (ref 0.0–0.7)
Eosinophils Relative: 2.9 % (ref 0.0–5.0)
HCT: 46.3 % (ref 39.0–52.0)
Hemoglobin: 15.9 g/dL (ref 13.0–17.0)
Lymphocytes Relative: 30.1 % (ref 12.0–46.0)
Lymphs Abs: 1.8 10*3/uL (ref 0.7–4.0)
MCHC: 34.4 g/dL (ref 30.0–36.0)
MCV: 84.5 fl (ref 78.0–100.0)
Monocytes Absolute: 0.5 10*3/uL (ref 0.1–1.0)
Monocytes Relative: 9.2 % (ref 3.0–12.0)
Neutro Abs: 3.3 10*3/uL (ref 1.4–7.7)
Neutrophils Relative %: 56.9 % (ref 43.0–77.0)
Platelets: 314 10*3/uL (ref 150.0–400.0)
RBC: 5.48 Mil/uL (ref 4.22–5.81)
RDW: 12.6 % (ref 11.5–15.5)
WBC: 5.8 10*3/uL (ref 4.0–10.5)

## 2023-07-01 LAB — COMPREHENSIVE METABOLIC PANEL WITH GFR
ALT: 21 U/L (ref 0–53)
AST: 14 U/L (ref 0–37)
Albumin: 4.6 g/dL (ref 3.5–5.2)
Alkaline Phosphatase: 53 U/L (ref 39–117)
BUN: 17 mg/dL (ref 6–23)
CO2: 26 meq/L (ref 19–32)
Calcium: 9.5 mg/dL (ref 8.4–10.5)
Chloride: 104 meq/L (ref 96–112)
Creatinine, Ser: 0.92 mg/dL (ref 0.40–1.50)
GFR: 102.87 mL/min (ref >60.00)
Glucose, Bld: 104 mg/dL — ABNORMAL HIGH (ref 70–99)
Potassium: 4 meq/L (ref 3.5–5.1)
Sodium: 136 meq/L (ref 135–145)
Total Bilirubin: 1 mg/dL (ref 0.2–1.2)
Total Protein: 6.9 g/dL (ref 6.0–8.3)

## 2023-07-01 LAB — LIPID PANEL
Cholesterol: 166 mg/dL (ref 0–200)
HDL: 30.9 mg/dL — ABNORMAL LOW (ref >39.00)
LDL Cholesterol: 111 mg/dL — ABNORMAL HIGH (ref 0–99)
NonHDL: 135.54
Total CHOL/HDL Ratio: 5
Triglycerides: 121 mg/dL (ref 0.0–149.0)
VLDL: 24.2 mg/dL (ref 0.0–40.0)

## 2023-07-01 LAB — TSH: TSH: 0.38 u[IU]/mL (ref 0.35–5.50)

## 2023-07-01 MED ORDER — OTEZLA 30 MG PO TABS: 30.0000 mg | ORAL_TABLET | Freq: Two times a day (BID) | ORAL | Status: AC

## 2023-07-01 NOTE — Assessment & Plan Note (Signed)
 Here for CPX  - Tdap 2018 - Vaccines are recommended: Flu shot every fall, COVID booster, PNM 20 (on Otezla). -Diet and exercise: Doing well.  Works for [employer], walks 8 to 10 miles daily. Laboratory: CMP FLP CBC TSH hep B serology

## 2023-07-01 NOTE — Patient Instructions (Signed)
 Vaccines I recommend: Consider COVID booster Flu shot every fall A pneumonia shot  Check the  blood pressure monthly Blood pressure goal:  between 110/65 and  135/85. If it is consistently higher or lower, let me know     GO TO THE LAB :  Get the blood work   Your results will be posted on MyChart with my comments  Next office visit for a physical exam in 1 year Please make an appointment before you leave today

## 2023-07-01 NOTE — Assessment & Plan Note (Signed)
 Here for CPX  Other issues:  HTN: On amlodipine , losartan , no recent BP checks, recommend to start checking.  Check labs. Psoriasis: Had a flareup this year, urgent care Rx prednisone with only temporarily helped, then he got worse.  Saw dermatology, tried a number of creams and eventually was prescribed Otezla with good control of symptoms. RTC 1 year

## 2023-07-01 NOTE — Progress Notes (Signed)
 Subjective:    Patient ID: Christopher Lester, male    DOB: October 22, 1980, 43 y.o.   MRN: [id number]  DOS:  07/01/2023 Type of visit - description: CPX  Here for CPX Feeling well. Had a bout of psoriasis.  Now better.  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   HTN (hypertension)    started meds 2010   Varicocele 04/24/2010   Varicocele diagnosed with a scrotal ultrasound, left greater than right     Past Surgical History:  Procedure Laterality Date   HERNIA REPAIR Left 02-2012   WISDOM TOOTH EXTRACTION  age 16    XI ROBOTIC ASSISTED INGUINAL HERNIA REPAIR WITH MESH Right 05/19/2020   Procedure: XI ROBOTIC ASSISTED RIGHT INGUINAL HERNIA REPAIR WITH MESH;  Surgeon: Tanda Locus, MD;  Location: [facility];  Service: General;  Laterality: Right;   Social History   Socioeconomic History   Marital status: Married    Spouse name: Not on file   Number of children: 2   Years of education: Not on file   Highest education level: Not on file  Occupational History   Occupation: works for the [place]  [employer]    Employer: [employer]  Tobacco Use   Smoking status: Never   Smokeless tobacco: Never  Vaping Use   Vaping status: Never Used  Substance and Sexual Activity   Alcohol use: Yes    Comment: rarely   Drug use: No   Sexual activity: Not on file  Other Topics Concern   Not on file  Social History Narrative   Married, 2 children (boy 2011, girl 2012)     1 step son (2006)   works for the [employer]       Social Drivers of Corporate investment banker Strain: Not on [employer] Insecurity: Not on file  Transportation Needs: Not on file  Physical Activity: Not on file  Stress: Not on file  Social Connections: Not on file  Intimate Partner Violence: Not on file    Current Outpatient Medications   Medication Instructions   amLODipine  (NORVASC ) 10 mg, Oral, Daily   cetirizine (ZYRTEC) 10 mg, Daily   EPINEPHrine  (EPI-PEN) 1 mg, Intramuscular, Once PRN   losartan  (COZAAR ) 50 mg, Oral, Daily   Otezla 30 mg, Oral, 2 times daily       Objective:   Physical Exam BP 118/80   Pulse 90   Temp 98.1 F (36.7 C) (Oral)   Resp 16   Ht 5' 10 (1.778 m)   Wt 199 lb 6 oz (90.4 kg)   SpO2 99%   BMI 28.61 kg/m  General: Well developed, NAD, BMI noted Neck: No  thyromegaly  HEENT:  Normocephalic . Face symmetric, atraumatic Lungs:  CTA B Normal respiratory effort, no intercostal retractions, no accessory muscle use. Heart: RRR,  no murmur.  Abdomen:  Not distended, soft, non-tender. No rebound or rigidity.   Lower extremities: no pretibial edema bilaterally  Skin: Exposed areas without rash. Not pale. Not jaundice Neurologic:  alert & oriented X3.  Speech normal, gait appropriate for age and unassisted Strength symmetric and appropriate for age.  Psych: Cognition and judgment appear intact.  Cooperative with normal attention span and concentration.  Behavior  appropriate. No anxious or depressed appearing.     Assessment    Assessment  HTN started medications 2010 Severe reaction to bee sting Psoriasis, guttate dx  2012 Varicocele dx 2012 by [place]  L>R h/o anxiety H/o asthma FH hemochromatosis   PLAN  Here for CPX  - Tdap 2018 - Vaccines are recommended: Flu shot every fall, COVID booster, PNM 20 (on Otezla). -Diet and exercise: Doing well.  Works for [employer], walks 8 to 10 miles daily. Laboratory: CMP FLP CBC TSH hep B serology Other issues:  HTN: On amlodipine , losartan , no recent BP checks, recommend to start checking.  Check labs. Psoriasis: Had a flareup this year, urgent care Rx prednisone with only temporarily helped, then he got worse.  Saw dermatology, tried a number of creams and eventually was prescribed Otezla with good control of symptoms. RTC 1  year

## 2023-07-02 ENCOUNTER — Ambulatory Visit: Payer: Self-pay | Admitting: Internal Medicine

## 2023-07-02 LAB — HEPATITIS B SURFACE ANTIBODY, QUANTITATIVE: Hep B S AB Quant (Post): 5 m[IU]/mL — ABNORMAL LOW (ref >=10)

## 2023-08-04 DIAGNOSIS — H60502 Unspecified acute noninfective otitis externa, left ear: Secondary | ICD-10-CM | POA: Diagnosis not present

## 2023-08-04 DIAGNOSIS — H6692 Otitis media, unspecified, left ear: Secondary | ICD-10-CM | POA: Diagnosis not present

## 2024-07-02 ENCOUNTER — Encounter: Admitting: Internal Medicine
